# Patient Record
Sex: Female | Born: 1960 | Smoking: Current every day smoker
Health system: Southern US, Community
[De-identification: ages and names within clinical notes are randomized; demographics above are authoritative.]

## PROBLEM LIST (undated history)

## (undated) DIAGNOSIS — C801 Malignant (primary) neoplasm, unspecified: Secondary | ICD-10-CM

---

## 2019-10-14 ENCOUNTER — Other Ambulatory Visit: Payer: Self-pay | Admitting: Physician Assistant

## 2019-10-14 DIAGNOSIS — J438 Other emphysema: Secondary | ICD-10-CM

## 2019-10-14 DIAGNOSIS — Z720 Tobacco use: Secondary | ICD-10-CM

## 2019-10-26 ENCOUNTER — Ambulatory Visit
Admission: RE | Admit: 2019-10-26 | Discharge: 2019-10-26 | Disposition: A | Discharge status: Home / Self Care | Payer: Medicare Other | Source: Ambulatory Visit | Attending: Physician Assistant | Admitting: Physician Assistant

## 2019-10-26 ENCOUNTER — Other Ambulatory Visit: Payer: Self-pay

## 2019-10-26 DIAGNOSIS — J438 Other emphysema: Secondary | ICD-10-CM

## 2019-10-26 DIAGNOSIS — Z720 Tobacco use: Secondary | ICD-10-CM

## 2020-04-03 ENCOUNTER — Inpatient Hospital Stay (HOSPITAL_COMMUNITY): Payer: Medicare HMO

## 2020-04-03 ENCOUNTER — Inpatient Hospital Stay (HOSPITAL_COMMUNITY)
Admission: EM | Admit: 2020-04-03 | Discharge: 2020-04-08 | DRG: 175 | Disposition: A | Discharge status: Home / Self Care | Payer: Medicare HMO | Attending: Family Medicine | Admitting: Family Medicine

## 2020-04-03 ENCOUNTER — Emergency Department (HOSPITAL_COMMUNITY): Payer: Medicare HMO

## 2020-04-03 ENCOUNTER — Encounter (HOSPITAL_COMMUNITY): Payer: Self-pay

## 2020-04-03 ENCOUNTER — Other Ambulatory Visit: Payer: Self-pay

## 2020-04-03 DIAGNOSIS — E440 Moderate protein-calorie malnutrition: Secondary | ICD-10-CM | POA: Insufficient documentation

## 2020-04-03 DIAGNOSIS — R531 Weakness: Secondary | ICD-10-CM | POA: Diagnosis not present

## 2020-04-03 DIAGNOSIS — R471 Dysarthria and anarthria: Secondary | ICD-10-CM | POA: Diagnosis present

## 2020-04-03 DIAGNOSIS — T451X5A Adverse effect of antineoplastic and immunosuppressive drugs, initial encounter: Secondary | ICD-10-CM | POA: Diagnosis present

## 2020-04-03 DIAGNOSIS — C3491 Malignant neoplasm of unspecified part of right bronchus or lung: Secondary | ICD-10-CM | POA: Diagnosis present

## 2020-04-03 DIAGNOSIS — Z87440 Personal history of urinary (tract) infections: Secondary | ICD-10-CM

## 2020-04-03 DIAGNOSIS — Z20822 Contact with and (suspected) exposure to covid-19: Secondary | ICD-10-CM | POA: Diagnosis present

## 2020-04-03 DIAGNOSIS — I2699 Other pulmonary embolism without acute cor pulmonale: Secondary | ICD-10-CM | POA: Diagnosis present

## 2020-04-03 DIAGNOSIS — R0602 Shortness of breath: Secondary | ICD-10-CM

## 2020-04-03 DIAGNOSIS — I81 Portal vein thrombosis: Secondary | ICD-10-CM | POA: Diagnosis present

## 2020-04-03 DIAGNOSIS — J439 Emphysema, unspecified: Secondary | ICD-10-CM | POA: Diagnosis present

## 2020-04-03 DIAGNOSIS — R609 Edema, unspecified: Secondary | ICD-10-CM

## 2020-04-03 DIAGNOSIS — G319 Degenerative disease of nervous system, unspecified: Secondary | ICD-10-CM | POA: Diagnosis present

## 2020-04-03 DIAGNOSIS — R079 Chest pain, unspecified: Secondary | ICD-10-CM | POA: Diagnosis present

## 2020-04-03 DIAGNOSIS — Z66 Do not resuscitate: Secondary | ICD-10-CM | POA: Diagnosis present

## 2020-04-03 DIAGNOSIS — K862 Cyst of pancreas: Secondary | ICD-10-CM | POA: Diagnosis present

## 2020-04-03 DIAGNOSIS — D638 Anemia in other chronic diseases classified elsewhere: Secondary | ICD-10-CM | POA: Diagnosis present

## 2020-04-03 DIAGNOSIS — Z515 Encounter for palliative care: Secondary | ICD-10-CM

## 2020-04-03 DIAGNOSIS — I700 Atherosclerosis of aorta: Secondary | ICD-10-CM | POA: Diagnosis present

## 2020-04-03 DIAGNOSIS — Z902 Acquired absence of lung [part of]: Secondary | ICD-10-CM

## 2020-04-03 DIAGNOSIS — R160 Hepatomegaly, not elsewhere classified: Secondary | ICD-10-CM | POA: Diagnosis present

## 2020-04-03 DIAGNOSIS — A09 Infectious gastroenteritis and colitis, unspecified: Secondary | ICD-10-CM | POA: Diagnosis present

## 2020-04-03 DIAGNOSIS — C787 Secondary malignant neoplasm of liver and intrahepatic bile duct: Secondary | ICD-10-CM | POA: Diagnosis present

## 2020-04-03 DIAGNOSIS — M7989 Other specified soft tissue disorders: Secondary | ICD-10-CM | POA: Diagnosis present

## 2020-04-03 DIAGNOSIS — J918 Pleural effusion in other conditions classified elsewhere: Secondary | ICD-10-CM | POA: Diagnosis present

## 2020-04-03 DIAGNOSIS — E876 Hypokalemia: Secondary | ICD-10-CM | POA: Diagnosis present

## 2020-04-03 DIAGNOSIS — R296 Repeated falls: Secondary | ICD-10-CM | POA: Diagnosis present

## 2020-04-03 DIAGNOSIS — Z808 Family history of malignant neoplasm of other organs or systems: Secondary | ICD-10-CM

## 2020-04-03 DIAGNOSIS — R5383 Other fatigue: Secondary | ICD-10-CM | POA: Diagnosis present

## 2020-04-03 DIAGNOSIS — R600 Localized edema: Secondary | ICD-10-CM

## 2020-04-03 DIAGNOSIS — W19XXXA Unspecified fall, initial encounter: Secondary | ICD-10-CM | POA: Diagnosis present

## 2020-04-03 DIAGNOSIS — I82401 Acute embolism and thrombosis of unspecified deep veins of right lower extremity: Secondary | ICD-10-CM | POA: Diagnosis present

## 2020-04-03 DIAGNOSIS — J9601 Acute respiratory failure with hypoxia: Secondary | ICD-10-CM | POA: Diagnosis present

## 2020-04-03 DIAGNOSIS — R5381 Other malaise: Secondary | ICD-10-CM | POA: Diagnosis not present

## 2020-04-03 DIAGNOSIS — E039 Hypothyroidism, unspecified: Secondary | ICD-10-CM | POA: Diagnosis present

## 2020-04-03 DIAGNOSIS — R071 Chest pain on breathing: Secondary | ICD-10-CM | POA: Diagnosis not present

## 2020-04-03 DIAGNOSIS — F17210 Nicotine dependence, cigarettes, uncomplicated: Secondary | ICD-10-CM | POA: Diagnosis present

## 2020-04-03 DIAGNOSIS — D539 Nutritional anemia, unspecified: Secondary | ICD-10-CM | POA: Diagnosis present

## 2020-04-03 DIAGNOSIS — J90 Pleural effusion, not elsewhere classified: Secondary | ICD-10-CM

## 2020-04-03 DIAGNOSIS — D509 Iron deficiency anemia, unspecified: Secondary | ICD-10-CM | POA: Diagnosis present

## 2020-04-03 DIAGNOSIS — F319 Bipolar disorder, unspecified: Secondary | ICD-10-CM | POA: Diagnosis not present

## 2020-04-03 DIAGNOSIS — R188 Other ascites: Secondary | ICD-10-CM | POA: Diagnosis present

## 2020-04-03 DIAGNOSIS — R824 Acetonuria: Secondary | ICD-10-CM | POA: Diagnosis present

## 2020-04-03 DIAGNOSIS — G249 Dystonia, unspecified: Secondary | ICD-10-CM | POA: Diagnosis present

## 2020-04-03 DIAGNOSIS — R946 Abnormal results of thyroid function studies: Secondary | ICD-10-CM | POA: Diagnosis present

## 2020-04-03 DIAGNOSIS — Z882 Allergy status to sulfonamides status: Secondary | ICD-10-CM

## 2020-04-03 DIAGNOSIS — R7989 Other specified abnormal findings of blood chemistry: Secondary | ICD-10-CM

## 2020-04-03 DIAGNOSIS — Z7189 Other specified counseling: Secondary | ICD-10-CM | POA: Diagnosis not present

## 2020-04-03 DIAGNOSIS — Z8507 Personal history of malignant neoplasm of pancreas: Secondary | ICD-10-CM

## 2020-04-03 DIAGNOSIS — Z79899 Other long term (current) drug therapy: Secondary | ICD-10-CM

## 2020-04-03 DIAGNOSIS — Z888 Allergy status to other drugs, medicaments and biological substances status: Secondary | ICD-10-CM

## 2020-04-03 HISTORY — DX: Malignant (primary) neoplasm, unspecified: C80.1

## 2020-04-03 LAB — COMPREHENSIVE METABOLIC PANEL
ALT: 17 U/L (ref 0–44)
AST: 25 U/L (ref 15–41)
Albumin: 1.9 g/dL — ABNORMAL LOW (ref 3.5–5.0)
Alkaline Phosphatase: 121 U/L (ref 38–126)
Anion gap: 12 (ref 5–15)
BUN: 18 mg/dL (ref 6–20)
CO2: 25 mmol/L (ref 22–32)
Calcium: 7.7 mg/dL — ABNORMAL LOW (ref 8.9–10.3)
Chloride: 98 mmol/L (ref 98–111)
Creatinine, Ser: 0.82 mg/dL (ref 0.44–1.00)
GFR, Estimated: >60 mL/min (ref >60)
Glucose, Bld: 86 mg/dL (ref 70–99)
Potassium: 2.9 mmol/L — ABNORMAL LOW (ref 3.5–5.1)
Sodium: 135 mmol/L (ref 135–145)
Total Bilirubin: 0.5 mg/dL (ref 0.3–1.2)
Total Protein: 5.8 g/dL — ABNORMAL LOW (ref 6.5–8.1)

## 2020-04-03 LAB — HIV ANTIBODY (ROUTINE TESTING W REFLEX): HIV Screen 4th Generation wRfx: NONREACTIVE

## 2020-04-03 LAB — CBC WITH DIFFERENTIAL/PLATELET
Abs Immature Granulocytes: 0.84 10*3/uL — ABNORMAL HIGH (ref 0.00–0.07)
Basophils Absolute: 0.1 10*3/uL (ref 0.0–0.1)
Basophils Relative: 1 %
Eosinophils Absolute: 0 10*3/uL (ref 0.0–0.5)
Eosinophils Relative: 0 %
HCT: 27.8 % — ABNORMAL LOW (ref 36.0–46.0)
Hemoglobin: 8.6 g/dL — ABNORMAL LOW (ref 12.0–15.0)
Immature Granulocytes: 4 %
Lymphocytes Relative: 12 %
Lymphs Abs: 2.4 10*3/uL (ref 0.7–4.0)
MCH: 33.6 pg (ref 26.0–34.0)
MCHC: 30.9 g/dL (ref 30.0–36.0)
MCV: 108.6 fL — ABNORMAL HIGH (ref 80.0–100.0)
Monocytes Absolute: 1.9 10*3/uL — ABNORMAL HIGH (ref 0.1–1.0)
Monocytes Relative: 9 %
Neutro Abs: 15.8 10*3/uL — ABNORMAL HIGH (ref 1.7–7.7)
Neutrophils Relative %: 74 %
Platelets: 266 10*3/uL (ref 150–400)
RBC: 2.56 MIL/uL — ABNORMAL LOW (ref 3.87–5.11)
RDW: 25 % — ABNORMAL HIGH (ref 11.5–15.5)
WBC: 21.1 10*3/uL — ABNORMAL HIGH (ref 4.0–10.5)
nRBC: 0.1 % (ref 0.0–0.2)

## 2020-04-03 LAB — URINALYSIS, ROUTINE W REFLEX MICROSCOPIC
Bilirubin Urine: NEGATIVE
Glucose, UA: NEGATIVE mg/dL
Hgb urine dipstick: NEGATIVE
Ketones, ur: 5 mg/dL — AB
Leukocytes,Ua: NEGATIVE
Nitrite: NEGATIVE
Protein, ur: NEGATIVE mg/dL
Specific Gravity, Urine: 1.019 (ref 1.005–1.030)
pH: 6 (ref 5.0–8.0)

## 2020-04-03 LAB — MAGNESIUM: Magnesium: 2.1 mg/dL (ref 1.7–2.4)

## 2020-04-03 LAB — I-STAT BETA HCG BLOOD, ED (MC, WL, AP ONLY): I-stat hCG, quantitative: <5 m[IU]/mL (ref <5)

## 2020-04-03 LAB — TSH: TSH: 11.124 u[IU]/mL — ABNORMAL HIGH (ref 0.350–4.500)

## 2020-04-03 LAB — POC SARS CORONAVIRUS 2 AG -  ED: SARS Coronavirus 2 Ag: NEGATIVE

## 2020-04-03 LAB — D-DIMER, QUANTITATIVE: D-Dimer, Quant: 7.08 ug/mL-FEU — ABNORMAL HIGH (ref 0.00–0.50)

## 2020-04-03 LAB — PROTIME-INR
INR: 2.3 — ABNORMAL HIGH (ref 0.8–1.2)
INR: 2 — ABNORMAL HIGH (ref 0.8–1.2)
Prothrombin Time: 24.1 seconds — ABNORMAL HIGH (ref 11.4–15.2)
Prothrombin Time: 22 seconds — ABNORMAL HIGH (ref 11.4–15.2)

## 2020-04-03 LAB — LACTIC ACID, PLASMA: Lactic Acid, Venous: 1.5 mmol/L (ref 0.5–1.9)

## 2020-04-03 LAB — AMMONIA: Ammonia: 23 umol/L (ref 9–35)

## 2020-04-03 LAB — POC OCCULT BLOOD, ED: Fecal Occult Bld: NEGATIVE

## 2020-04-03 LAB — FOLATE: Folate: 14.7 ng/mL (ref >5.9)

## 2020-04-03 LAB — VITAMIN B12: Vitamin B-12: 4497 pg/mL — ABNORMAL HIGH (ref 180–914)

## 2020-04-03 LAB — PROCALCITONIN: Procalcitonin: 0.41 ng/mL

## 2020-04-03 LAB — BRAIN NATRIURETIC PEPTIDE: B Natriuretic Peptide: 80.3 pg/mL (ref 0.0–100.0)

## 2020-04-03 LAB — TROPONIN I (HIGH SENSITIVITY): Troponin I (High Sensitivity): 8 ng/L (ref <18)

## 2020-04-03 LAB — APTT: aPTT: 45 seconds — ABNORMAL HIGH (ref 24–36)

## 2020-04-03 LAB — CBG MONITORING, ED: Glucose-Capillary: 101 mg/dL — ABNORMAL HIGH (ref 70–99)

## 2020-04-03 MED ORDER — POTASSIUM CHLORIDE CRYS ER 20 MEQ PO TBCR
40.0000 meq | EXTENDED_RELEASE_TABLET | Freq: Once | ORAL | Status: AC
  Administered 2020-04-03: 40 meq via ORAL
  Filled 2020-04-03: qty 2

## 2020-04-03 MED ORDER — CHLORHEXIDINE GLUCONATE CLOTH 2 % EX PADS
6.0000 | MEDICATED_PAD | Freq: Every day | CUTANEOUS | Status: DC
  Administered 2020-04-03 – 2020-04-08 (×6): 6 via TOPICAL

## 2020-04-03 MED ORDER — ONDANSETRON HCL 4 MG PO TABS: 4.0000 mg | ORAL_TABLET | Freq: Four times a day (QID) | ORAL | Status: DC | PRN

## 2020-04-03 MED ORDER — ENOXAPARIN SODIUM 60 MG/0.6ML ~~LOC~~ SOLN
50.0000 mg | Freq: Once | SUBCUTANEOUS | Status: AC
  Administered 2020-04-03: 50 mg via SUBCUTANEOUS
  Filled 2020-04-03: qty 0.6

## 2020-04-03 MED ORDER — IOHEXOL 300 MG/ML  SOLN
100.0000 mL | Freq: Once | INTRAMUSCULAR | Status: AC | PRN
  Administered 2020-04-03: 80 mL via INTRAVENOUS

## 2020-04-03 MED ORDER — MOMETASONE FURO-FORMOTEROL FUM 200-5 MCG/ACT IN AERO
2.0000 | INHALATION_SPRAY | Freq: Two times a day (BID) | RESPIRATORY_TRACT | Status: DC
  Administered 2020-04-03 – 2020-04-08 (×11): 2 via RESPIRATORY_TRACT
  Filled 2020-04-03: qty 8.8

## 2020-04-03 MED ORDER — HEPARIN SOD (PORK) LOCK FLUSH 100 UNIT/ML IV SOLN
500.0000 [IU] | Freq: Once | INTRAVENOUS | Status: AC
  Administered 2020-04-05: 500 [IU] via INTRAVENOUS
  Filled 2020-04-03: qty 5

## 2020-04-03 MED ORDER — FUROSEMIDE 10 MG/ML IJ SOLN
40.0000 mg | Freq: Once | INTRAMUSCULAR | Status: AC
  Administered 2020-04-03: 40 mg via INTRAVENOUS
  Filled 2020-04-03: qty 4

## 2020-04-03 MED ORDER — HEPARIN SOD (PORK) LOCK FLUSH 100 UNIT/ML IV SOLN
INTRAVENOUS | Status: AC
  Filled 2020-04-03: qty 5

## 2020-04-03 MED ORDER — MAGNESIUM SULFATE IN D5W 1-5 GM/100ML-% IV SOLN
1.0000 g | Freq: Once | INTRAVENOUS | Status: AC
  Administered 2020-04-03: 1 g via INTRAVENOUS
  Filled 2020-04-03: qty 100

## 2020-04-03 MED ORDER — ADULT MULTIVITAMIN W/MINERALS CH
1.0000 | ORAL_TABLET | Freq: Every day | ORAL | Status: DC
  Administered 2020-04-03 – 2020-04-08 (×6): 1 via ORAL
  Filled 2020-04-03 (×6): qty 1

## 2020-04-03 MED ORDER — ACETAMINOPHEN 650 MG RE SUPP: 650.0000 mg | Freq: Four times a day (QID) | RECTAL | Status: DC | PRN

## 2020-04-03 MED ORDER — ENSURE ENLIVE PO LIQD
237.0000 mL | Freq: Two times a day (BID) | ORAL | Status: DC
  Administered 2020-04-03 – 2020-04-08 (×6): 237 mL via ORAL

## 2020-04-03 MED ORDER — ALBUTEROL SULFATE HFA 108 (90 BASE) MCG/ACT IN AERS
2.0000 | INHALATION_SPRAY | Freq: Four times a day (QID) | RESPIRATORY_TRACT | Status: DC | PRN
  Filled 2020-04-03: qty 6.7

## 2020-04-03 MED ORDER — POTASSIUM CHLORIDE IN NACL 20-0.9 MEQ/L-% IV SOLN
INTRAVENOUS | Status: AC
  Filled 2020-04-03 (×2): qty 1000

## 2020-04-03 MED ORDER — ONDANSETRON HCL 4 MG/2ML IJ SOLN
4.0000 mg | Freq: Four times a day (QID) | INTRAMUSCULAR | Status: DC | PRN
  Administered 2020-04-04 – 2020-04-08 (×2): 4 mg via INTRAVENOUS
  Filled 2020-04-03 (×2): qty 2

## 2020-04-03 MED ORDER — IOHEXOL 350 MG/ML SOLN
100.0000 mL | Freq: Once | INTRAVENOUS | Status: AC | PRN
  Administered 2020-04-03: 80 mL via INTRAVENOUS

## 2020-04-03 MED ORDER — ENOXAPARIN SODIUM 60 MG/0.6ML ~~LOC~~ SOLN
60.0000 mg | Freq: Two times a day (BID) | SUBCUTANEOUS | Status: DC
  Administered 2020-04-03 – 2020-04-07 (×8): 60 mg via SUBCUTANEOUS
  Filled 2020-04-03 (×8): qty 0.6

## 2020-04-03 MED ORDER — IOHEXOL 9 MG/ML PO SOLN
500.0000 mL | ORAL | Status: AC
  Administered 2020-04-03 (×2): 500 mL via ORAL

## 2020-04-03 MED ORDER — SODIUM CHLORIDE 0.9 % IV SOLN
2.0000 g | INTRAVENOUS | Status: DC
  Administered 2020-04-03 – 2020-04-05 (×3): 2 g via INTRAVENOUS
  Filled 2020-04-03 (×2): qty 2
  Filled 2020-04-03: qty 20

## 2020-04-03 MED ORDER — ACETAMINOPHEN 325 MG PO TABS
650.0000 mg | ORAL_TABLET | Freq: Four times a day (QID) | ORAL | Status: DC | PRN
  Administered 2020-04-04: 650 mg via ORAL
  Filled 2020-04-03: qty 2

## 2020-04-03 MED ORDER — SODIUM CHLORIDE 0.9 % IV SOLN
500.0000 mg | INTRAVENOUS | Status: DC
  Administered 2020-04-03 – 2020-04-05 (×3): 500 mg via INTRAVENOUS
  Filled 2020-04-03 (×3): qty 500

## 2020-04-03 MED ORDER — SENNOSIDES-DOCUSATE SODIUM 8.6-50 MG PO TABS: 1.0000 | ORAL_TABLET | Freq: Every evening | ORAL | Status: DC | PRN

## 2020-04-03 NOTE — H&P (Signed)
History and Physical    April Huff MHD:[id number] DOB: April 12, 1960 DOA: 04/03/2020  PCP: Mindi Curling, PA-C   Patient coming from: Home   Chief Complaint: General weakness, falls, SOB, low oxygen saturation  HPI: April Huff is a 60 y.o. 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  She is accompanied by her sister with whom she lives.  Patient's family has become concerned that the patient is increasingly fatigued and generally weak resulting in multiple falls recently.  She was reportedly saturating 80% at home.  Reports shortness of breath central chest pain that is worse with inspiration, reports hitting her head couple days ago reports persistent headaches, denies loss of consciousness, denies fevers or chills, denies cough.  She has recently developed bilateral lower extremity swelling.  She has not noticed orthopnea.  She recently completed antibiotic for a UTI.  ED Course: Upon arrival to the ED, patient is found to be afebrile, saturating mid to upper 90s on 3 L/min of supplemental oxygen, and with blood pressure 92/60.  EKG features a sinus rhythm and chest x-ray is notable for interval development of left base atelectasis or infiltrate with small left pleural effusion.  Chemistry panel notable for potassium 2.9 and albumin 1.9.  CBC features a leukocytosis to 21,100 and microcytic anemia with hemoglobin 8.6.  Lactic acid is normal.  BNP is normal.  Urinalysis with ketonuria.  Blood and urine cultures were collected in the emergency department and the patient was treated with oral potassium and supplemental oxygen.  Review of Systems:  All other systems reviewed and apart from HPI, are negative.  Past Medical History:  Diagnosis Date  . Cancer  [facility])     History reviewed. No pertinent surgical history.  Social History:   reports that she has been smoking cigarettes. She has a 40.00 pack-year smoking history. She has never used smokeless tobacco. No history on file for alcohol use and drug use.  Allergies  Allergen Reactions  . Nitrofurantoin   . Sulfa Antibiotics     Family History  Problem Relation Age of Onset  . Thyroid cancer Mother      Prior to Admission medications   Not on File    Physical Exam: Vitals:   04/03/20 0400 04/03/20 0430 04/03/20 0510 04/03/20 0556  BP: (!) 158/133 (!) 150/136 92/60 109/60  Pulse: 90 88 92 90  Resp: 15 16 18 18   Temp:   98.1 F (36.7 C) (!) 97.5 F (36.4 C)  TempSrc:   Oral Oral  SpO2: 99% 97% 97% 93%  Weight:    56.5 kg  Height:        Constitutional: NAD, somnolent  Eyes: PERTLA, lids and conjunctivae normal ENMT: Mucous membranes are moist. Posterior pharynx clear of any exudate or lesions.   Neck: normal, supple, no masses, no thyromegaly Respiratory: Diminished breath sounds, no wheezing. No pallor or cyanosis.  Cardiovascular: S1 & S2 heard, regular rate and rhythm. Pretibial pitting edema bilaterally.   Abdomen: No distension, no tenderness, soft. Bowel sounds active.  Musculoskeletal: no clubbing / cyanosis. No joint deformity upper and lower extremities.   Skin: no significant rashes, lesions, ulcers. Warm, dry, well-perfused. Neurologic: CN 2-12 grossly intact. Mild dysarthria. Sensation intact. Moving all extremities.  Psychiatric: Sleeping, wakes to voice and oriented to person, place, and situation. Pleasant  and cooperative.    Labs and Imaging on Admission: I have personally reviewed following labs and imaging studies  CBC: Recent Labs  Lab 04/03/20 0157  WBC 21.1*  NEUTROABS 15.8*  HGB 8.6*  HCT 27.8*  MCV 108.6*  PLT 062   Basic Metabolic Panel: Recent Labs  Lab 04/03/20 0157  NA 135  K 2.9*  CL 98  CO2 25  GLUCOSE 86  BUN 18   CREATININE 0.82  CALCIUM 7.7*   GFR: Estimated Creatinine Clearance: 59.1 mL/min (by C-G formula based on SCr of 0.82 mg/dL). Liver Function Tests: Recent Labs  Lab 04/03/20 0157  AST 25  ALT 17  ALKPHOS 121  BILITOT 0.5  PROT 5.8*  ALBUMIN 1.9*   No results for input(s): LIPASE, AMYLASE in the last 168 hours. No results for input(s): AMMONIA in the last 168 hours. Coagulation Profile: Recent Labs  Lab 04/03/20 0157  INR 2.3*   Cardiac Enzymes: No results for input(s): CKTOTAL, CKMB, CKMBINDEX, TROPONINI in the last 168 hours. BNP (last 3 results) No results for input(s): PROBNP in the last 8760 hours. HbA1C: No results for input(s): HGBA1C in the last 72 hours. CBG: Recent Labs  Lab 04/03/20 0240  GLUCAP 101*   Lipid Profile: No results for input(s): CHOL, HDL, LDLCALC, TRIG, CHOLHDL, LDLDIRECT in the last 72 hours. Thyroid Function Tests: No results for input(s): TSH, T4TOTAL, FREET4, T3FREE, THYROIDAB in the last 72 hours. Anemia Panel: No results for input(s): VITAMINB12, FOLATE, FERRITIN, TIBC, IRON, RETICCTPCT in the last 72 hours. Urine analysis:    Component Value Date/Time   COLORURINE AMBER (A) 04/03/2020 0157   APPEARANCEUR CLEAR 04/03/2020 0157   LABSPEC 1.019 04/03/2020 0157   PHURINE 6.0 04/03/2020 0157   GLUCOSEU NEGATIVE 04/03/2020 0157   HGBUR NEGATIVE 04/03/2020 0157   BILIRUBINUR NEGATIVE 04/03/2020 0157   KETONESUR 5 (A) 04/03/2020 0157   PROTEINUR NEGATIVE 04/03/2020 0157   NITRITE NEGATIVE 04/03/2020 0157   LEUKOCYTESUR NEGATIVE 04/03/2020 0157   Sepsis Labs: @LABRCNTIP (procalcitonin:4,lacticidven:4) )No results found for this or any previous visit (from the past 240 hour(s)).   Radiological Exams on Admission: DG Chest Port 1 View  Result Date: 04/03/2020 CLINICAL DATA:  Sepsis EXAM: PORTABLE CHEST 1 VIEW COMPARISON:  CT 10/26/2019 FINDINGS: Small left pleural effusion has developed with associated left basilar atelectasis or  infiltrate. Minimal right basilar atelectasis. No pneumothorax. Cardiac size within normal limits. Right internal jugular chest port is seen with its tip within the superior vena cava. Pulmonary vascularity is normal. No acute bone abnormality. IMPRESSION: Interval development of left basilar atelectasis or infiltrate with associated small left pleural effusion. Electronically Signed   By: Fidela Salisbury MD   On: 04/03/2020 02:15    EKG: Independently reviewed. Sinus rhythm.   Assessment/Plan   1. Acute hypoxic respiratory failure  - Presents with SOB, general weakness, falls, fatigue, and reports oxygen saturation 80% at home  - There is LLL atelectasis vs infection on CXR with small effusion - She is now reporting pleuritic chest pain  - BNP is normal, no wheezing  - Check d-dimer and procalcitonin, culture sputum, check strep pneumo and legionella antigens, start Rocpehin and azithromycin for now, start incentive spirometry, and continue supplemental O2 as needed   2. Debility, falls   - Presents with SOB, general weakness, fatigue, and recurrent falls at home  - She is somnolent, has mild dysarthria but no definite focal deficit  - Check head CT, TSH, ammonia, B12, and folate, continue supportive  care, consult PT    3. Lung cancer   - Stage IIB adenocarcinoma right lung status post right upper lobectomy in November 2021, follows with Dr. Georgiann Cocker in [place], now on adjuvant chemotherapy with carboplatin/pemetrexed   4. Chest pain  - Patient reports central chest pain, worse with inspiration  - No acute ischemic features noted on EKG  - Check d-dimer and troponin, continue cardiac monitoring    5. COPD  - No wheezing on admission  - Continue inhalers    6. Hypokalemia  - Serum potassium is 2.9  - Replace, repeat chemistry panel   7. Hypothyroidism  - Checking TSH as above, follow-up pharmacy medication reconciliation    8. Bipolar disorder  - Plan to continue home  medications pending pharmacy medication reconciliation    9. Leg swelling  - Symmetric pitting edema to b/l LEs with normal BNP and no known hx of CHF  - Likely related to low albumin (1.9)  - Elevated legs, consult dietary   10. Macrocytic anemia  - Hgb is 8.6 on admission, up from 7.2 three weeks ago  - FOBT is negative, likely related to chemotherapy - Check B12 and folate, transfuse as needed    DVT prophylaxis: SCDs pending head CT  Code Status: Full  Level of Care: Level of care: Telemetry Family Communication: None available at time of admission  Disposition Plan:  Patient is from: Home  Anticipated d/c is to: TBD Anticipated d/c date is: 04/06/20 Patient currently: Pending CT head, additional labs as above, therapy assessment  Consults called: none  Admission status: Inpatient     Vianne Bulls, MD [facility]  04/03/2020, 6:26 AM

## 2020-04-03 NOTE — ED Triage Notes (Addendum)
Patient BIB [facility] from home with complaints of hurting all over since she started chemo in January. Patient complaining more about the pain in her chest tonight. Patient had right upper lobe removed of lung. Last treatment was February 1st. Patient has had multiple falls over the past month, weak, in a lot of pain. Patient had UTI and finished antibiotics yesterday.   EMS vitals HR 90 100/60 Capnography 22-24 RR 18 CBG 132 3L 97% at home, EMS put her back on 1L  22G left forearm 375mL normal saline given in route

## 2020-04-03 NOTE — Progress Notes (Signed)
PT Cancellation Note  Patient Details Name: April Huff MRN: [id number] DOB: 06/17/1960   Cancelled Treatment:    Reason Eval/Treat Not Completed: Medical issues which prohibited therapy - acute PE, just received first dose of Lovenox and per PT protocol pt not to be mobilized until 3 hours post-Lovenox administration. PT to check back tomorrow.  Stacie Glaze, PT Acute Rehabilitation Services Pager [phone]  Office [phone]    Louis Matte 04/03/2020, 3:34 PM

## 2020-04-03 NOTE — ED Notes (Signed)
Asked patient to provide urine sample.

## 2020-04-03 NOTE — Progress Notes (Signed)
PT Cancellation Note  Patient Details Name: April Huff MRN: [id number] DOB: 1961-01-13   Cancelled Treatment:    Reason Eval/Treat Not Completed: Patient at procedure or test/unavailable - Pt going to CT in 10-15 minutes, PT to check back as schedule allows for PT evaluation.   Stacie Glaze, PT Acute Rehabilitation Services Pager [phone]  Office [phone]    Louis Matte 04/03/2020, 12:10 PM

## 2020-04-03 NOTE — ED Provider Notes (Signed)
[facility] DEPT Provider Note   CSN: [id number] Arrival date & time: 04/03/20  0135     History Chief Complaint  Patient presents with  . Fatigue    April Huff is a 60 y.o. female.  Patient presents to the emergency department with a chief complaint of shortness of breath.  She has history of lung cancer, and had prior lobectomy.  She [facility].  She stays with her sister at home.  Sister reports that the patient has had multiple falls recently due to weakness.  She states that she gets very winded when she is up and walking.  States that her O2 saturation was in the mid 80s earlier tonight, and she called EMS.  Patient has improved with 3 L nasal cannula from EMS, and has now been able to tolerate regular 1 L nasal cannula.  She denies cough.  Denies fever.  She does state that she feels very weak.  She was recently treated for UTI and just finished the antibiotics.  She is receiving chemotherapy for her lung cancer.  The history is provided by the patient. No language interpreter was used.       Past Medical History:  Diagnosis Date  . Cancer ([place])     There are no problems to display for this patient.   History reviewed. No pertinent surgical history.   OB History   No obstetric history on file.     History reviewed. No pertinent family history.     Home Medications Prior to Admission medications   Not on File    Allergies    Nitrofurantoin and Sulfa antibiotics  Review of Systems   Review of Systems  All other systems reviewed and are negative.   Physical Exam Updated Vital Signs BP (!) 162/142   Pulse 93   Temp 99.2 F (37.3 C) (Rectal)   Resp 14   Ht 5\' 2"  (1.575 m)   Wt 61.2 kg   SpO2 97%   BMI 24.69 kg/m   Physical Exam Vitals and nursing note reviewed.  Constitutional:      General: She is not in acute distress.    Appearance: She is well-developed and well-nourished.     Comments:  Chronically ill-appearing  HENT:     Head: Normocephalic and atraumatic.  Eyes:     Conjunctiva/sclera: Conjunctivae normal.  Cardiovascular:     Rate and Rhythm: Regular rhythm. Tachycardia present.     Heart sounds: No murmur heard.   Pulmonary:     Effort: Pulmonary effort is normal. No respiratory distress.     Breath sounds: Normal breath sounds.  Abdominal:     Palpations: Abdomen is soft.     Tenderness: There is no abdominal tenderness.  Musculoskeletal:     Cervical back: Neck supple.     Right lower leg: Edema present.     Left lower leg: Edema present.  Skin:    General: Skin is warm and dry.  Neurological:     Mental Status: She is alert and oriented to person, place, and time.  Psychiatric:        Mood and Affect: Mood and affect and mood normal.        Behavior: Behavior normal.     ED Results / Procedures / Treatments   Labs (all labs ordered are listed, but only abnormal results are displayed) Labs Reviewed  COMPREHENSIVE METABOLIC PANEL - Abnormal; Notable for the following components:  Result Value   Potassium 2.9 (*)    Calcium 7.7 (*)    Total Protein 5.8 (*)    Albumin 1.9 (*)    All other components within normal limits  CBC WITH DIFFERENTIAL/PLATELET - Abnormal; Notable for the following components:   WBC 21.1 (*)    RBC 2.56 (*)    Hemoglobin 8.6 (*)    HCT 27.8 (*)    MCV 108.6 (*)    RDW 25.0 (*)    Neutro Abs 15.8 (*)    Monocytes Absolute 1.9 (*)    Abs Immature Granulocytes 0.84 (*)    All other components within normal limits  PROTIME-INR - Abnormal; Notable for the following components:   Prothrombin Time 24.1 (*)    INR 2.3 (*)    All other components within normal limits  APTT - Abnormal; Notable for the following components:   aPTT 45 (*)    All other components within normal limits  CBG MONITORING, ED - Abnormal; Notable for the following components:   Glucose-Capillary 101 (*)    All other components within normal  limits  CULTURE, BLOOD (SINGLE)  URINE CULTURE  BRAIN NATRIURETIC PEPTIDE  LACTIC ACID, PLASMA  LACTIC ACID, PLASMA  URINALYSIS, ROUTINE W REFLEX MICROSCOPIC  I-STAT BETA HCG BLOOD, ED ([facility], [facility], [facility] ONLY)  POC OCCULT BLOOD, ED  POC SARS CORONAVIRUS 2 AG -  ED    EKG EKG Interpretation  Date/Time:  Monday April 03 2020 02:28:37 EST Ventricular Rate:  90 PR Interval:    QRS Duration: 76 QT Interval:  377 QTC Calculation: 462 R Axis:   36 Text Interpretation: Sinus rhythm Borderline low voltage, extremity leads Confirmed by Veryl Speak [phone]) on 04/03/2020 2:31:48 AM   Radiology DG Chest Port 1 View  Result Date: 04/03/2020 CLINICAL DATA:  Sepsis EXAM: PORTABLE CHEST 1 VIEW COMPARISON:  CT 10/26/2019 FINDINGS: Small left pleural effusion has developed with associated left basilar atelectasis or infiltrate. Minimal right basilar atelectasis. No pneumothorax. Cardiac size within normal limits. Right internal jugular chest port is seen with its tip within the superior vena cava. Pulmonary vascularity is normal. No acute bone abnormality. IMPRESSION: Interval development of left basilar atelectasis or infiltrate with associated small left pleural effusion. Electronically Signed   By: Fidela Salisbury MD   On: 04/03/2020 02:15    Procedures Procedures   Medications Ordered in ED Medications - No data to display  ED Course  I have reviewed the triage vital signs and the nursing notes.  Pertinent labs & imaging results that were available during my care of the patient were reviewed by me and considered in my medical decision making (see chart for details).    MDM Rules/Calculators/A&P                          This patient complains of shortness of breath, this involves an extensive number of treatment options, and is a complaint that carries with it a high risk of complications and morbidity.    Differential Dx Pneumonia, CHF, sepsis, PE has history of lung cancer and  lobectomy  Pertinent Labs I ordered, reviewed, and interpreted labs, which included CBC notable for elevated white blood cell count at 21.1, potassium is 2.9, hemoglobin 8.6  Hemoccult is negative.  Imaging Interpretation I ordered imaging studies which included chest x-ray, which showed interval development of left basilar atelectasis or infiltrate along with small left-sided pleural effusion.   Sources Additional history  obtained from sister, who reports that the patient has been very winded and has had several falls. Previous records obtained and reviewed.   Critical Interventions  None  Reassessments After the interventions stated above, I reevaluated the patient and found agreeable with plan for admission.  Consultants Appreciate Dr. Myna Hidalgo for admitting for SOB.  Plan Admit    Final Clinical Impression(s) / ED Diagnoses Final diagnoses:  SOB (shortness of breath)  Peripheral edema  Pleural effusion    Rx / DC Orders ED Discharge Orders    None       Montine Circle, PA-C 04/03/20 0423    Veryl Speak, MD 04/03/20 [phone]

## 2020-04-03 NOTE — Progress Notes (Signed)
Initial Nutrition Assessment  DOCUMENTATION CODES:   Non-severe (moderate) malnutrition in context of chronic illness  INTERVENTION:   -Ensure Enlive po BID, each supplement provides 350 kcal and 20 grams of protein  -Multivitamin with minerals daily  NUTRITION DIAGNOSIS:   Moderate Malnutrition related to cancer and cancer related treatments,chronic illness as evidenced by energy intake < or equal to 75% for > or equal to 1 month,moderate fat depletion,moderate muscle depletion.  GOAL:   Patient will meet greater than or equal to 90% of their needs  MONITOR:   PO intake,Supplement acceptance,Labs,Weight trends,I & O's  REASON FOR ASSESSMENT:   Consult Assessment of nutrition requirement/status  ASSESSMENT:   60 year old female with a past medical history significant for but not limited to COPD, bipolar disorder, hypothyroidism, adenocarcinoma of the right lung status post right upper lobe lobectomy now chemotherapy as well as other comorbidities who presented to the ED for evaluation of generalized weakness, shortness of breath and falls.  Patient in room, pretty drowsy. Pt kept falling asleep until her lunch tray came and she was set up to eat. Pt with noticeable tremor but states she is able to eat independently. Pt reports her appetite has not been good since starting chemo in January. When asked is she is hungry today, she states 'a little bit'.   No weight history in chart. Per nursing documentation, pt with moderate BLE edema.   Medications: KLOR-CON, IV Mg sulfate  Labs reviewed: CBGs: 101  NUTRITION - FOCUSED PHYSICAL EXAM:  Flowsheet Row Most Recent Value  Orbital Region Mild depletion  Upper Arm Region Moderate depletion  Thoracic and Lumbar Region Unable to assess  Buccal Region Mild depletion  Temple Region Mild depletion  Clavicle Bone Region Mild depletion  Clavicle and Acromion Bone Region Mild depletion  Scapular Bone Region Mild depletion   Dorsal Hand Moderate depletion  Patellar Region Unable to assess  Anterior Thigh Region Unable to assess  Posterior Calf Region Unable to assess  Edema (RD Assessment) Moderate       Diet Order:   Diet Order            Diet Heart Room service appropriate? Yes; Fluid consistency: Thin  Diet effective now                 EDUCATION NEEDS:   No education needs have been identified at this time  Skin:  Skin Assessment: Reviewed RN Assessment  Last BM:  2/27  Height:   Ht Readings from Last 1 Encounters:  04/03/20 5\' 2"  (1.575 m)    Weight:   Wt Readings from Last 1 Encounters:  04/03/20 56.5 kg    BMI:  Body mass index is 22.78 kg/m.  Estimated Nutritional Needs:   Kcal:  1450-1650  Protein:  70-80g  Fluid:  1.6L/day   Clayton Bibles, MS, RD, LDN Inpatient Clinical Dietitian Contact information available via Amion

## 2020-04-03 NOTE — Progress Notes (Signed)
Bilateral lower extremity venous duplex has been completed. Preliminary results can be found in CV Proc through chart review.  Results were given to the patient's nurse, [facility].  04/03/20 11:03 AM Carlos Levering RVT

## 2020-04-03 NOTE — Progress Notes (Signed)
Care started prior to midnight in the emergency room and patient is admitted early this morning after midnight by Dr. Christia Reading Opyd and I am in current agreement with his assessment and plan.  Additional changes to the plan of care been made accordingly.  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  She is accompanied by her sister whom she lives with.  She has been increasingly fatigued and generally weak resulting in multiple falls recently.  Reportedly saturation at home was 80%.  Also reports shortness of breath and central chest pain that is worse with inspiration and also reported hitting her head after a fall and reporting persistent headaches.  She denies any loss of consciousness, fevers or chills.  Recently he developed bilateral lower extremity swelling.  She has not noticed any orthopnea.  Of note she recently completed antibiotics for urinary tract infection.  On arrival to the emergency room she is found to be afebrile but saturating mid to upper 90s on 3 L of supplemental oxygen with soft blood pressure of 92/60.  Chest x-ray was notable for interval development of left base atelectasis or infiltrate with small pleural effusion.  She is noted to be hypokalemic and had a significantly low albumin at 1.9.  She had an elevated WBC of 21.1 and a microcytic anemia.  BMP and lactic acid were normal.  Blood and urine cultures were collected in the emergency room and she was given oral potassium supplementation.  Currently she is being admitted and treated for the following but not limited to:  Acute hypoxic respiratory failure  - Presents with SOB, general weakness, falls, fatigue, and reports oxygen  saturation 80% at home  - There is LLL atelectasis vs infection on CXR with small effusion - She is now reporting pleuritic chest pain  -SpO2: 97 % O2 Flow Rate (L/min): 3 L/min - BNP is normal at 80.3, no wheezing  - Check d-dimer was elevated to 7.08 and procalcitonin was 0.41, culture sputum, check strep pneumo and legionella antigens,  -She was initiated Rocpehin and azithromycin for now and will continue, start incentive spirometry, and continue supplemental O2 as needed  -We will check a CTA of the chest to rule out PE and also extremity Dopplers given her lower leg swelling. 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 Georgiann Cocker in [place], now on adjuvant chemotherapy with carboplatin/pemetrexed  -Plan a CTA of the chest to rule out PE and evaluate as well  Chest pain  - Patient reports central chest pain, worse with inspiration  - No acute ischemic features noted on EKG  - Check d-dimer 7.08 and troponin was 8, continue cardiac monitoring   -If necessary will consider obtaining echocardiogram  COPD  - No wheezing on admission  - Continue inhalers   -  Requiring supplemental oxygen via nasal cannula as above and wean as tolerated  Hypokalemia  - Serum potassium is 2.9  - Replace, repeat chemistry panel   Hypothyroidism  - Checking TSH as above and was 11.124, follow-up pharmacy medication reconciliation   -Check free T4 and T3  Bipolar disorder  - Plan to continue home medications pending pharmacy medication reconciliation    Leg swelling  -  Symmetric pitting edema to b/l LEs with normal BNP and no known hx of CHF  - Likely related to low albumin (1.9) but will need to rule out VTE and obtain lower extremity duplex given elevated D-dimer -Necessary will consider obtaining echocardiogram - Elevated legs, consult dietary   Macrocytic anemia  - Hgb is 8.6 on admission, up from 7.2 three weeks ago  - FOBT is negative, likely related to chemotherapy - Check B12 and was 4497 and folate was 14.7, transfuse as needed   We will continue to monitor the patient's clinical response to intervention and repeat blood work and imaging in the a.m.

## 2020-04-03 NOTE — Progress Notes (Signed)
Date and time results received: 04/03/20 20:45  Test: CT of abdomen Critical Value: See results of imaging in Epic-results tab/Radiology call from [facility] per  Dr. Owens Shark.   Name of Provider Notified: Hospitalist Ouma  Orders Received? Or Actions Taken?:Just paged, awaiting orders.

## 2020-04-04 ENCOUNTER — Inpatient Hospital Stay (HOSPITAL_COMMUNITY): Payer: Medicare HMO

## 2020-04-04 DIAGNOSIS — F319 Bipolar disorder, unspecified: Secondary | ICD-10-CM | POA: Diagnosis not present

## 2020-04-04 DIAGNOSIS — R071 Chest pain on breathing: Secondary | ICD-10-CM | POA: Diagnosis not present

## 2020-04-04 DIAGNOSIS — J9601 Acute respiratory failure with hypoxia: Secondary | ICD-10-CM | POA: Diagnosis not present

## 2020-04-04 DIAGNOSIS — K8689 Other specified diseases of pancreas: Secondary | ICD-10-CM

## 2020-04-04 DIAGNOSIS — R160 Hepatomegaly, not elsewhere classified: Secondary | ICD-10-CM

## 2020-04-04 DIAGNOSIS — C3491 Malignant neoplasm of unspecified part of right bronchus or lung: Secondary | ICD-10-CM | POA: Diagnosis not present

## 2020-04-04 LAB — CBC WITH DIFFERENTIAL/PLATELET
Abs Immature Granulocytes: 0.49 10*3/uL — ABNORMAL HIGH (ref 0.00–0.07)
Basophils Absolute: 0.1 10*3/uL (ref 0.0–0.1)
Basophils Relative: 1 %
Eosinophils Absolute: 0 10*3/uL (ref 0.0–0.5)
Eosinophils Relative: 0 %
HCT: 23.7 % — ABNORMAL LOW (ref 36.0–46.0)
Hemoglobin: 7.1 g/dL — ABNORMAL LOW (ref 12.0–15.0)
Immature Granulocytes: 3 %
Lymphocytes Relative: 14 %
Lymphs Abs: 2.5 10*3/uL (ref 0.7–4.0)
MCH: 33 pg (ref 26.0–34.0)
MCHC: 30 g/dL (ref 30.0–36.0)
MCV: 110.2 fL — ABNORMAL HIGH (ref 80.0–100.0)
Monocytes Absolute: 1.6 10*3/uL — ABNORMAL HIGH (ref 0.1–1.0)
Monocytes Relative: 9 %
Neutro Abs: 12.8 10*3/uL — ABNORMAL HIGH (ref 1.7–7.7)
Neutrophils Relative %: 73 %
Platelets: 187 10*3/uL (ref 150–400)
RBC: 2.15 MIL/uL — ABNORMAL LOW (ref 3.87–5.11)
RDW: 24.5 % — ABNORMAL HIGH (ref 11.5–15.5)
WBC: 17.4 10*3/uL — ABNORMAL HIGH (ref 4.0–10.5)
nRBC: 0.2 % (ref 0.0–0.2)

## 2020-04-04 LAB — STREP PNEUMONIAE URINARY ANTIGEN: Strep Pneumo Urinary Antigen: NEGATIVE

## 2020-04-04 LAB — PROCALCITONIN: Procalcitonin: 0.24 ng/mL

## 2020-04-04 LAB — COMPREHENSIVE METABOLIC PANEL
ALT: 14 U/L (ref 0–44)
AST: 20 U/L (ref 15–41)
Albumin: 1.4 g/dL — ABNORMAL LOW (ref 3.5–5.0)
Alkaline Phosphatase: 93 U/L (ref 38–126)
Anion gap: 10 (ref 5–15)
BUN: 14 mg/dL (ref 6–20)
CO2: 24 mmol/L (ref 22–32)
Calcium: 7.3 mg/dL — ABNORMAL LOW (ref 8.9–10.3)
Chloride: 100 mmol/L (ref 98–111)
Creatinine, Ser: 0.62 mg/dL (ref 0.44–1.00)
GFR, Estimated: >60 mL/min (ref >60)
Glucose, Bld: 69 mg/dL — ABNORMAL LOW (ref 70–99)
Potassium: 3.5 mmol/L (ref 3.5–5.1)
Sodium: 134 mmol/L — ABNORMAL LOW (ref 135–145)
Total Bilirubin: 0.7 mg/dL (ref 0.3–1.2)
Total Protein: 4.6 g/dL — ABNORMAL LOW (ref 6.5–8.1)

## 2020-04-04 LAB — PHOSPHORUS: Phosphorus: 2.8 mg/dL (ref 2.5–4.6)

## 2020-04-04 LAB — MAGNESIUM: Magnesium: 2 mg/dL (ref 1.7–2.4)

## 2020-04-04 MED ORDER — LACTATED RINGERS IV BOLUS
500.0000 mL | Freq: Once | INTRAVENOUS | Status: AC
  Administered 2020-04-04: 500 mL via INTRAVENOUS

## 2020-04-04 MED ORDER — SODIUM CHLORIDE 0.9% FLUSH
10.0000 mL | Freq: Two times a day (BID) | INTRAVENOUS | Status: DC
  Administered 2020-04-04 – 2020-04-08 (×9): 10 mL

## 2020-04-04 MED ORDER — ALTEPLASE 2 MG IJ SOLR
2.0000 mg | Freq: Once | INTRAMUSCULAR | Status: AC
  Administered 2020-04-04: 2 mg
  Filled 2020-04-04: qty 2

## 2020-04-04 MED ORDER — SODIUM CHLORIDE 0.9% FLUSH: 10.0000 mL | INTRAVENOUS | Status: DC | PRN

## 2020-04-04 NOTE — Progress Notes (Signed)
   04/04/20 0143  Assess: MEWS Score  Temp 97.6 F (36.4 C)  BP (!) 81/59  Pulse Rate (!) 127  Level of Consciousness Alert  SpO2 93 %  O2 Device Nasal Cannula  O2 Flow Rate (L/min) 4 L/min  Assess: MEWS Score  MEWS Temp 0  MEWS Systolic 1  MEWS Pulse 2  MEWS RR 0  MEWS LOC 0  MEWS Score 3  MEWS Score Color Yellow  Treat  Pain Scale 0-10  Pain Score 7  Pain Type Acute pain  Pain Location Head  Pain Orientation Right;Left;Mid  Pain Descriptors / Indicators Aching  Pain Frequency Occasional  Pain Onset Awakened from sleep  Patients Stated Pain Goal 2  Pain Intervention(s) Medication (See eMAR)  Complains of  (pain)  Neuro symptoms relieved by Relaxation techniques (Comment);Rest  Take Vital Signs  Increase Vital Sign Frequency  Yellow: Q 2hr X 2 then Q 4hr X 2, if remains yellow, continue Q 4hrs  Escalate  MEWS: Escalate Yellow: discuss with charge nurse/RN and consider discussing with provider and RRT  Notify: Charge Nurse/RN  Name of Charge Nurse/RN Notified Carmin Richmond, Rn  Date Charge Nurse/RN Notified 04/04/20  Time Charge Nurse/RN Notified [phone]  Notify: Provider  Provider Name/Title Ouma, hospitalist  Date Provider Notified 04/04/20  Time Provider Notified [phone]  Notification Type  (secure chat)  Notification Reason Other (Comment) (low BP)  Provider response See new orders  Date of Provider Response 04/04/20  Time of Provider Response [phone]  Proider requested a BP check and patient has low BP and high pulse.

## 2020-04-04 NOTE — Progress Notes (Signed)
Patient's sister Alinda Dooms called to check on patient informed her about the night the patient had. She wanted to know when the physician will be in to speak with her and I informed her that it will mostly likely be after 7 a.m. Informed her that I will pass this request on to the dayshift nurse.

## 2020-04-04 NOTE — NC FL2 (Signed)
[place] LEVEL OF CARE SCREENING TOOL     IDENTIFICATION  Patient Name: April Huff Birthdate: July 29, 1960 Sex: female Admission Date (Current Location): 04/03/2020  [facility] and [place] Number:  Herbalist and Address:  [facility],  [address] [place], [place]      Provider Number: [id number]  Attending Physician Name and Address:  Kerney Elbe, DO  Relative Name and Phone Number:       Current Level of Care: Hospital Recommended Level of Care: [place] Prior Approval Number:    Date Approved/Denied:   PASRR Number: Pending  Discharge Plan: SNF    Current Diagnoses: Patient Active Problem List   Diagnosis Date Noted  . Acute respiratory failure with hypoxia ([place]) 04/03/2020  . Adenocarcinoma of right lung ([place]) 04/03/2020  . Debility 04/03/2020  . Macrocytic anemia 04/03/2020  . Hypokalemia 04/03/2020  . Leg swelling 04/03/2020  . COPD (chronic obstructive pulmonary disease) with emphysema ([place]) 04/03/2020  . Bipolar disorder ([place]) 04/03/2020  . Hypothyroid 04/03/2020  . Chest pain 04/03/2020  . Malnutrition of moderate degree 04/03/2020  . Peripheral edema     Orientation RESPIRATION BLADDER Height & Weight     Self,Time,Situation,Place  O2 Continent Weight: 56.5 kg Height:  5\' 2"  (157.5 cm)  BEHAVIORAL SYMPTOMS/MOOD NEUROLOGICAL BOWEL NUTRITION STATUS      Continent Diet (Heart Healthy)  AMBULATORY STATUS COMMUNICATION OF NEEDS Skin   Extensive Assist Verbally Bruising                       Personal Care Assistance Level of Assistance  Bathing,Dressing Bathing Assistance: Limited assistance   Dressing Assistance: Limited assistance     Functional Limitations Info             SPECIAL CARE FACTORS FREQUENCY  PT (By licensed PT),OT (By licensed OT)     PT Frequency: 5 x weekly OT Frequency: 5 x weekly            Contractures Contractures Info: Not present     Additional Factors Info  Code Status,Allergies Code Status Info: Full Allergies Info: Nitrofurantoin, Sulfa Antibiotics           Current Medications (04/04/2020):  This is the current hospital active medication list Current Facility-Administered Medications  Medication Dose Route Frequency Provider Last Rate Last Admin  . acetaminophen (TYLENOL) tablet 650 mg  650 mg Oral Q6H PRN Opyd, Ilene Qua, MD   650 mg at 04/04/20 0205   Or  . acetaminophen (TYLENOL) suppository 650 mg  650 mg Rectal Q6H PRN Opyd, Ilene Qua, MD      . albuterol (VENTOLIN HFA) 108 (90 Base) MCG/ACT inhaler 2 puff  2 puff Inhalation Q6H PRN Opyd, Ilene Qua, MD      . alteplase (CATHFLO ACTIVASE) injection 2 mg  2 mg Intracatheter Once Sheikh, Omair Latif, DO      . azithromycin (ZITHROMAX) 500 mg in sodium chloride 0.9 % 250 mL IVPB  500 mg Intravenous Q24H Vianne Bulls, MD 250 mL/hr at 04/04/20 0847 500 mg at 04/04/20 0847  . cefTRIAXone (ROCEPHIN) 2 g in sodium chloride 0.9 % 100 mL IVPB  2 g Intravenous Q24H Opyd, Ilene Qua, MD 200 mL/hr at 04/04/20 0809 2 g at 04/04/20 0809  . Chlorhexidine Gluconate Cloth 2 % PADS 6 each  6 each Topical Daily Raiford Noble [place], [place]   6 each at 04/04/20 1056  . enoxaparin (LOVENOX) injection  60 mg  60 mg Subcutaneous Q12H SheikhGeorgina Quint [place], DO   60 mg at 04/04/20 1055  . feeding supplement (ENSURE ENLIVE / ENSURE PLUS) liquid 237 mL  237 mL Oral BID BM Sheikh, Omair Latif, DO   237 mL at 04/04/20 1055  . heparin lock flush 100 unit/mL  500 Units Intravenous Once Sheikh, Omair Latif, DO      . mometasone-formoterol (DULERA) 200-5 MCG/ACT inhaler 2 puff  2 puff Inhalation BID Opyd, Ilene Qua, MD   2 puff at 04/04/20 0834  . multivitamin with minerals tablet 1 tablet  1 tablet Oral Daily Raiford Noble [place], DO   1 tablet at 04/04/20 1055  . ondansetron (ZOFRAN) tablet 4 mg  4 mg Oral Q6H PRN Opyd, Ilene Qua, MD       Or  . ondansetron (ZOFRAN) injection 4 mg  4 mg  Intravenous Q6H PRN Opyd, Ilene Qua, MD      . senna-docusate (Senokot-S) tablet 1 tablet  1 tablet Oral QHS PRN Opyd, Ilene Qua, MD      . sodium chloride flush (NS) 0.9 % injection 10-40 mL  10-40 mL Intracatheter Q12H Sheikh, Omair Latif, DO   10 mL at 04/04/20 1056  . sodium chloride flush (NS) 0.9 % injection 10-40 mL  10-40 mL Intracatheter PRN Kerney Elbe, DO         Discharge Medications: Please see discharge summary for a list of discharge medications.  Relevant Imaging Results:  Relevant Lab Results:   Additional Information ss# [id number]  Markeshia Giebel, Marjie Skiff, RN

## 2020-04-04 NOTE — TOC Initial Note (Signed)
Transition of Care [facility]) - Initial/Assessment Note    Patient Details  Name: April Huff MRN: [id number] Date of Birth: 20-Aug-1960  Transition of [facility]) CM/SW Contact:    Julane Crock, Marjie Skiff, RN Phone Number: 04/04/2020, 2:20 PM  Clinical Narrative:                 Spoke with pt at bedside along with her sister whom she lives with. Pt has had multiple falls recently at home. Physical Therapy recommendations gone over with pt and sister. Permission given to fax out FL2 to area SNF's for bed availability. Started [facility] review, they have requested additional information. Unsure if pt is to continue chemotherapy at dc. This will potentially effect placement. TOC will follow.  Expected Discharge Plan: Skilled Nursing Facility Barriers to Discharge: Continued Medical Work up   Patient Goals and CMS Choice Patient states their goals for this hospitalization and ongoing recovery are:: To go home      Expected Discharge Plan and Services Expected Discharge Plan: [place]   Discharge Planning Services: CM Consult   Living arrangements for the past 2 months: Single Family Home                       Prior Living Arrangements/Services Living arrangements for the past 2 months: Single Family Home Lives with:: Siblings          Need for Family Participation in Patient Care: Yes (Comment) Care giver support system in place?: Yes (comment)      Activities of Daily Living Home Assistive Devices/Equipment: Eyeglasses,Oxygen,Walker (specify type) ADL Screening (condition at time of admission) Patient's cognitive ability adequate to safely complete daily activities?: Yes Is the patient deaf or have difficulty hearing?: No Does the patient have difficulty seeing, even when wearing glasses/contacts?: No Does the patient have difficulty concentrating, remembering, or making decisions?: No Patient able to express need for assistance with ADLs?: Yes Does the patient have  difficulty dressing or bathing?: Yes Independently performs ADLs?: No Communication: Independent Dressing (OT): Independent Grooming: Independent Feeding: Independent Bathing: Needs assistance Is this a change from baseline?: Pre-admission baseline Toileting: Needs assistance Is this a change from baseline?: Pre-admission baseline In/Out Bed: Needs assistance Is this a change from baseline?: Pre-admission baseline Walks in Home: Needs assistance Is this a change from baseline?: Pre-admission baseline Does the patient have difficulty walking or climbing stairs?: Yes Weakness of Legs: Both Weakness of Arms/Hands: Both  Permission Sought/Granted                  Emotional Assessment Appearance:: Appears older than stated age Attitude/Demeanor/Rapport: Guarded Affect (typically observed): Calm Orientation: : Oriented to Self,Oriented to Place,Oriented to Situation,Oriented to  Time Alcohol / Substance Use: Not Applicable    Admission diagnosis:  Peripheral edema [R60.9] SOB (shortness of breath) [R06.02] Pleural effusion [J90] Acute respiratory failure with hypoxia ([place]) [J96.01] Patient Active Problem List   Diagnosis Date Noted  . Acute respiratory failure with hypoxia ([place]) 04/03/2020  . Adenocarcinoma of right lung ([place]) 04/03/2020  . Debility 04/03/2020  . Macrocytic anemia 04/03/2020  . Hypokalemia 04/03/2020  . Leg swelling 04/03/2020  . COPD (chronic obstructive pulmonary disease) with emphysema ([place]) 04/03/2020  . Bipolar disorder ([place]) 04/03/2020  . Hypothyroid 04/03/2020  . Chest pain 04/03/2020  . Malnutrition of moderate degree 04/03/2020  . Peripheral edema    PCP:  Juanda [facility]:   [place], [place] - [address]  [place] [place] [address] Phone: [phone] Fax: [phone]     Social Determinants of Health (SDOH) Interventions    Readmission Risk  Interventions Readmission Risk Prevention Plan 04/04/2020  Transportation Screening Complete  PCP or Specialist Appt within 3-5 Days Complete  HRI or [place] Complete  Social Work Consult for [place] Planning/Counseling Complete  Palliative Care Screening Not Applicable  Medication Review Press photographer) Complete

## 2020-04-04 NOTE — Evaluation (Signed)
Physical Therapy Evaluation Patient Details Name: April Huff MRN: [id number] DOB: January 06, 1961 Today's Date: 04/04/2020   History of Present Illness  60 yo female [facility]28 with complaints of widespread pain especially chest since starting chemo 02/2020, as well as frequent falls. CXR shows L basilar atelectasis vs infiltrate, L pleural effusion. CT angio chest 2/28 shows PE in lingular pulmonary artery branch, bilateral pleural effusions L>R with atelectasis lower lobes L>R, Imaging showed new masses on R medial lobe liver and pancreas.  Pt with recent UTI and antibiotics. PMH includes stage IIB adenocarcinoma of R lung with previous R upper lobectomy November 2021 and currently undergoing chemo,  COPD, current smoker, bipolar disorder, hypothyroidism.  Clinical Impression  Pt admitted with above diagnosis. Pt is lethargic but arouses to verbal/tactile stimulii, and can follow commands. Min assist for bed to bedside commode transfer, SaO2 91% on 3L O2, HR 123 max with activity. Pt fatigues very quickly, ambulation was deferred 2* fatigue with transfer x 2. Pt lives with her sister who has had several falls recently. Pt will likely need SNF level of care upon acute DC.  Pt currently with functional limitations due to the deficits listed below (see PT Problem List). Pt will benefit from skilled PT to increase their independence and safety with mobility to allow discharge to the venue listed below.       Follow Up Recommendations SNF;Supervision/Assistance - 24 hour;Supervision for mobility/OOB    Equipment Recommendations  Wheelchair cushion (measurements PT);Wheelchair (measurements PT)    Recommendations for Other Services       Precautions / Restrictions Precautions Precautions: Fall Precaution Comments: sister reports pt has had multiple falls recently Restrictions Weight Bearing Restrictions: No      Mobility  Bed Mobility Overal bed mobility: Needs Assistance Bed  Mobility: Supine to Sit     Supine to sit: Min assist     General bed mobility comments: min A to raise trunk and pivot hips to edge of bed    Transfers Overall transfer level: Needs assistance Equipment used: Rolling walker (2 wheeled) Transfers: Sit to/from [facility] Sit to Stand: Min assist Stand pivot transfers: Min assist       General transfer comment: VCs hand placement, min A to power up, min A for balance during SPT, uncontrolled descent, SPT x 2 (bed to 3 in1 to recliner), SaO2 91% on 3L O2 during activity, HR 123 max with activity  Ambulation/Gait             General Gait Details: deferred 2* fatigue with transfers  Stairs            Wheelchair Mobility    Modified Rankin (Stroke Patients Only)       Balance Overall balance assessment: Needs assistance;History of Falls Sitting-balance support: Feet supported;No upper extremity supported Sitting balance-Leahy Scale: Fair     Standing balance support: Bilateral upper extremity supported Standing balance-Leahy Scale: Poor Standing balance comment: relies on BUE support                             Pertinent Vitals/Pain Pain Assessment: No/denies pain    Home Living Family/patient expects to be discharged to:: Private residence Living Arrangements: Other relatives Available Help at Discharge: Family Type of Home: House Home Access: Stairs to enter   Technical brewer of Steps: 2 Home Layout: One level Home Equipment: Environmental consultant - 2 wheels;Bedside commode Additional Comments: lives with sister who  has also had several falls recently; was on 1L home O2 PTA    Prior Function Level of Independence: Needs assistance   Gait / Transfers Assistance Needed: walks with RW  ADL's / Homemaking Assistance Needed: assist needed        Hand Dominance        Extremity/Trunk Assessment   Upper Extremity Assessment Upper Extremity Assessment: Defer to OT  evaluation    Lower Extremity Assessment Lower Extremity Assessment: Generalized weakness (B knee ext -4/5)    Cervical / Trunk Assessment Cervical / Trunk Assessment: Kyphotic  Communication   Communication: No difficulties  Cognition Arousal/Alertness: Lethargic Behavior During Therapy: WFL for tasks assessed/performed Overall Cognitive Status: Within Functional Limits for tasks assessed                                        General Comments      Exercises     Assessment/Plan    PT Assessment Patient needs continued PT services  PT Problem List Decreased strength;Decreased mobility;Decreased activity tolerance;Decreased balance       PT Treatment Interventions Therapeutic activities;Gait training;Therapeutic exercise;Functional mobility training;Patient/family education;Balance training    PT Goals (Current goals can be found in the Care Plan section)  Acute Rehab PT Goals Patient Stated Goal: determine prognosis (new mets just found today), sister is open to hospice/SNF PT Goal Formulation: With family Time For Goal Achievement: 04/18/20 Potential to Achieve Goals: Fair    Frequency Min 2X/week   Barriers to discharge        Co-evaluation               AM-PAC PT "6 Clicks" Mobility  Outcome Measure Help needed turning from your back to your side while in a flat bed without using bedrails?: A Little Help needed moving from lying on your back to sitting on the side of a flat bed without using bedrails?: A Little Help needed moving to and from a bed to a chair (including a wheelchair)?: A Little Help needed standing up from a chair using your arms (e.g., wheelchair or bedside chair)?: A Little Help needed to walk in hospital room?: Total Help needed climbing 3-5 steps with a railing? : Total 6 Click Score: 14    End of Session Equipment Utilized During Treatment: Gait belt;Oxygen Activity Tolerance: Patient limited by fatigue;Patient  limited by lethargy Patient left: in chair;with call bell/phone within reach;with family/visitor present;with chair alarm set;with nursing/sitter in room Nurse Communication: Mobility status PT Visit Diagnosis: Muscle weakness (generalized) (M62.81);Difficulty in walking, not elsewhere classified (R26.2);History of falling (Z91.81)    Time: 5456-2563 PT Time Calculation (min) (ACUTE ONLY): 40 min   Charges:   PT Evaluation $PT Eval Moderate Complexity: 1 Mod PT Treatments $Therapeutic Activity: 23-37 mins        Blondell Reveal Kistler PT 04/04/2020  Acute Rehabilitation Services Pager [phone] Office [phone]

## 2020-04-04 NOTE — Progress Notes (Signed)
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  She is accompanied by her sister whom she lives with.  She has been increasingly fatigued and generally weak resulting in multiple falls recently.  Reportedly saturation at home was 80%.  Also reports shortness of breath and central chest pain that is worse with inspiration and also reported hitting her head after a fall and reporting persistent headaches.  She denies any loss of consciousness, fevers or chills.  Recently he developed bilateral lower extremity swelling.  She has not noticed any orthopnea.  Of note she recently completed antibiotics for urinary tract infection.  On arrival to the emergency room she is found to be afebrile but saturating mid to upper 90s on 3 L of supplemental oxygen with soft blood pressure of 92/60.  Chest x-ray was notable for interval development of left base atelectasis or infiltrate with small pleural effusion.  She is noted to be hypokalemic and had a significantly low albumin at 1.9.  She had an elevated WBC of 21.1 and a microcytic anemia.  BMP and lactic acid were normal.  Blood and urine cultures were collected in the emergency room and she was given oral potassium supplementation.  **Interim History She continues to be significantly weak and remains dyspneic and on supplemental oxygen.  CTA of the chest revealed that she had a abnormal mass on the liver and a CT scan of the abdomen pelvis done showed that she has multiple hypodense lesions as well as a cystic lesion in the pancreas.   Patient did not want MRI for further characterization currently.  We have consulted palliative care for further goals of care discussion but her main issue is her respiratory status and will try a little bit of Lasix if her blood pressure tolerates however because it is on the lower side we will hold off for now  Assessment & Plan:   Principal Problem:   Acute respiratory failure with hypoxia ([place]) Active Problems:   Adenocarcinoma of right lung (HCC)   Debility   Macrocytic anemia   Hypokalemia   Leg swelling   COPD (chronic obstructive pulmonary disease) with emphysema (HCC)   Bipolar disorder (HCC)   Hypothyroid   Chest pain   Peripheral edema   Malnutrition of moderate degree  Acute hypoxic respiratory failureadding of acute PE as well bilateral pleural effusions with right being worse than left with compressive atelectasis worse on the left compared to right -Presents with SOB, general weakness, falls, fatigue, and reports oxygen saturation 80% at home -There is LLL atelectasis vs infection on CXR with small effusion but CTA shows bilateral pleural effusions - She is now reporting pleuritic chest pain  -SpO2: 93 % O2 Flow Rate (L/min): 3 L/min - BNP is normal at 80.3, no wheezing -Check d-dimer was elevated to 7.08 and procalcitonin was 0.41, culture sputum, check strep pneumo and legionella antigens,  -She was initiated Rocpehin and azithromycin for now and will continue for now still -Started incentive spirometry, and continue supplemental O2 as needed -We will check a CTA of the chest to rule out PE and  also extremity Dopplers given her lower leg swelling. -CTA of the chest showed "Single small pulmonary embolus in a lingular pulmonary artery branch.  BILATERAL pleural effusions LEFT greater than RIGHT with compressive atelectasis of BILATERAL lower lobes, greater on LEFT. Interval resection of previously identified RIGHT upper lobe nodule. Mass medial RIGHT lobe liver  3.0 x 2.1 cm question metastatic disease. Suspected enlarged periportal lymph node. Aortic Atherosclerosis." -Lower extremity venous duplex also shows an acute right leg DVT and CT skin of the abdomen pelvis shows "Acute to subacute thrombosis of the portal vein, SMV, and splenic vein. Cystic mass within the pancreatic body, neoplasm cannot be excluded. Dedicated pancreatic MRI may be useful. 3. Multiple hypodense lesions within the right lobe liver, largest measuring 2.8 x 2.2 cm. Metastatic disease cannot be excluded. MRI may be useful for further evaluation. 4. Lymphadenopathy at the root of the mesentery, most pronounced along the SMA/SMV distribution. Metastatic disease is suspected. 5. Diffuse colonic wall thickening compatible with inflammatory or infectious colitis. 6. Trace ascites. 7. Stable bilateral pleural effusions and bibasilar consolidation, left greater than right. 8.  Aortic Atherosclerosis"  -We will start the patient on full dose Lovenox given her history of cancer -Procalcitonin level was 0.41 and trended down to 0.24 -Attic acid level was 1.5 -May benefit from also obtain an echocardiogram  Likely liver metastasis -CT of the chest shows mass in the medial right lobe liver is 3.0 x 2.1 cm -CT abdomen pelvis shows multiple hypodense lesions in the right lower lobe with the largest measuring 2.8 x 2.2 cm with metastatic disease not ruled out -Discussed with oncology and Dr. Alvy Bimler recommends stabilizing patient and having the patient follow-up with her primary oncologist for further evaluation  Suspected pancreatic mass/neoplasm -CTA of the abdomen pelvis showed cystic mass within the pancreatic body -Discussed with patient about getting MRI she does not want to pursue at this time  Debility, falls -Presents with SOB, general weakness, fatigue, and recurrent falls at home -She is somnolent, has mild dysarthria but no definite focal deficit -Check head CT and showed "No  acute intracranial abnormalities. 2. Mild chronic small vessel ischemic change and age advanced brain atrophy" -CHeck, TSH was 11.124 and will check a free T4, ammonia, B12 was 4497, and folate was 14.7,  -continue supportive care, consult PT  Lung cancer -Stage IIB adenocarcinoma right lung status post right upper lobectomy in November 2021, follows with Dr. Georgiann Cocker in [place], now on adjuvant chemotherapy with carboplatin/pemetrexed -Plan a CTA of the chest to rule out PE and evaluate as well but likely shows metastasis in the liver as well as inflammatory nodules and possible mass in the pancreas -We will initiate palliative care goals of care discussion given that the patient is still full code and palliative consulted  Chest Pain -Patient reports central chest pain, worse with inspiration -No acute ischemic features noted on EKG -Check d-dimer 7.08 and troponin was 8, continue cardiac monitoring -If necessary will consider obtaining echocardiogram will hold off for now  COPD -No wheezing on admission -Continue inhalersand remains on supplemental oxygen due to her PE and likely bilateral pleural effusions and compressive atelectasis -Requiring supplemental oxygen via nasal cannula as above and wean as tolerated  Hypokalemia -Serum potassium is 2.9on admission and repeat is now 3.5 -Replace, repeat chemistry panel  Hypothyroidism -Checking TSH as above and was 11.124, follow-up pharmacy medication reconciliation -Check free T4 and T3 and this is pending  Bipolar Disorder -Plan to continue home medications pending  pharmacy medication reconciliation   Leg swelling  - Symmetric pitting edema to b/l LEs with normal BNP and no known hx of CHF  - Likely related to low albumin (1.9) but will need to rule out VTE and obtain lower extremity duplex given elevated D-dimer -Necessary will consider obtaining echocardiogram - Elevated legs, consult  dietary   Macrocytic Anemia  - Hgb is 8.6 on admission, up from 7.2 three weeks ago  - FOBT is negative, likely related to chemotherapy - Check B12 and was 4497 and folate was 14.7, transfuse as needed  Malnutrition of Moderate Degree -Nutritionist consulted for further evaluation recommendations X-continue Ensure Enlive p.o. twice daily as well as multivitamin with minerals daily  DVT prophylaxis: Anticoagulated with Lovenox shots Code Status: FULL CODE  Family Communication: Discussed with Sister at bedside  Disposition Plan: Pending further clinical improvement   Status is: Inpatient  Remains inpatient appropriate because:Unsafe d/c plan, IV treatments appropriate due to intensity of illness or inability to take PO and Inpatient level of care appropriate due to severity of illness   Dispo: The patient is from: Home              Anticipated d/c is to: Home              Patient currently is not medically stable to d/c.   Difficult to place patient No  Consultants:   Palliative Care Medicine  Discussed with Oncology Dr. Alvy Bimler   Procedures:  LE VENOUS DUPLEX RIGHT:  - Findings consistent with acute superficial vein thrombosis involving the  right small saphenous vein.  - There is no evidence of deep vein thrombosis in the lower extremity.    - No cystic structure found in the popliteal fossa.    LEFT:  - There is no evidence of deep vein thrombosis in the lower extremity.    - No cystic structure found in the popliteal fossa.     *See table(s) above for measurements and observations.   Antimicrobials:  Anti-infectives (From admission, onward)   Start     Dose/Rate Route Frequency Ordered Stop   04/03/20 0800  cefTRIAXone (ROCEPHIN) 2 g in sodium chloride 0.9 % 100 mL IVPB        2 g 200 mL/hr over 30 Minutes Intravenous Every 24 hours 04/03/20 0624 04/08/20 0759   04/03/20 0800  azithromycin (ZITHROMAX) 500 mg in sodium chloride 0.9 % 250 mL IVPB         500 mg 250 mL/hr over 60 Minutes Intravenous Every 24 hours 04/03/20 0624 04/08/20 0759        Subjective: Seen and examined at bedside and she is about to work with therapy and was significantly weak and still short of breath.  She had some mild chest pain but no nausea or vomiting.  Denies any lightheadedness or dizziness.  No other concerns or complaints this time but remains significantly fatigued and unstable on her feet.  Objective: Vitals:   04/04/20 0555 04/04/20 0834 04/04/20 1056 04/04/20 1324  BP: (!) 109/58  93/72 96/67  Pulse: 94  100 95  Resp: 16  15 15   Temp: 98.6 F (37 C)  97.6 F (36.4 C) 97.6 F (36.4 C)  TempSrc: Oral  Oral Oral  SpO2: 93% 94% 91% 93%  Weight:      Height:        Intake/Output Summary (Last 24 hours) at 04/04/2020 1732 Last data filed at 04/04/2020 1311 Gross per 24 hour  Intake 860  ml  Output 650 ml  Net 210 ml   Filed Weights   04/03/20 0243 04/03/20 0556  Weight: 61.2 kg 56.5 kg   Examination: Physical Exam:  Constitutional: Patient is a thin chronically ill-appearing Caucasian female currently in mild distress Eyes: Lids and conjunctivae normal, sclerae anicteric  ENMT: External Ears, Nose appear normal. Grossly normal hearing. Neck: Appears normal, supple, no cervical masses, normal ROM, no appreciable thyromegaly; no appreciable JVD Respiratory: Diminished to auscultation bilaterally with coarse breath sounds and some crackles bilaterally.  Has some rhonchi as well.  Has a slightly increased respiratory effort and she is wearing supplemental oxygen via nasal cannula, Cardiovascular: RRR, no murmurs / rubs / gallops. S1 and S2 auscultated.  Has 1+ lower extremity edema worse in the right compared to left Abdomen: Soft, non-tender, non-distended. Bowel sounds positive x4.  GU: Deferred. Musculoskeletal: No clubbing / cyanosis of digits/nails. No joint deformity upper and lower extremities.  Skin: No rashes, lesions, ulcers on  limited skin evaluation. No induration; Warm and dry.  Neurologic: CN 2-12 grossly intact with no focal deficits. Romberg sign and cerebellar reflexes not assessed.  Psychiatric: Normal judgment and insight. Alert and oriented x 3.  Anxious and has a depressed appearing mood and affect  Data Reviewed: I have personally reviewed following labs and imaging studies  CBC: Recent Labs  Lab 04/03/20 0157 04/04/20 0732  WBC 21.1* 17.4*  NEUTROABS 15.8* 12.8*  HGB 8.6* 7.1*  HCT 27.8* 23.7*  MCV 108.6* 110.2*  PLT 266 703   Basic Metabolic Panel: Recent Labs  Lab 04/03/20 0157 04/03/20 0701 04/04/20 0732  NA 135  --  134*  K 2.9*  --  3.5  CL 98  --  100  CO2 25  --  24  GLUCOSE 86  --  69*  BUN 18  --  14  CREATININE 0.82  --  0.62  CALCIUM 7.7*  --  7.3*  MG  --  2.1 2.0  PHOS  --   --  2.8   GFR: Estimated Creatinine Clearance: 60.6 mL/min (by C-G formula based on SCr of 0.62 mg/dL). Liver Function Tests: Recent Labs  Lab 04/03/20 0157 04/04/20 0732  AST 25 20  ALT 17 14  ALKPHOS 121 93  BILITOT 0.5 0.7  PROT 5.8* 4.6*  ALBUMIN 1.9* 1.4*   No results for input(s): LIPASE, AMYLASE in the last 168 hours. Recent Labs  Lab 04/03/20 0753  AMMONIA 23   Coagulation Profile: Recent Labs  Lab 04/03/20 0157 04/03/20 1503  INR 2.3* 2.0*   Cardiac Enzymes: No results for input(s): CKTOTAL, CKMB, CKMBINDEX, TROPONINI in the last 168 hours. BNP (last 3 results) No results for input(s): PROBNP in the last 8760 hours. HbA1C: No results for input(s): HGBA1C in the last 72 hours. CBG: Recent Labs  Lab 04/03/20 0240  GLUCAP 101*   Lipid Profile: No results for input(s): CHOL, HDL, LDLCALC, TRIG, CHOLHDL, LDLDIRECT in the last 72 hours. Thyroid Function Tests: Recent Labs    04/03/20 0701  TSH 11.124*   Anemia Panel: Recent Labs    04/03/20 0701  VITAMINB12 4,497*  FOLATE 14.7   Sepsis Labs: Recent Labs  Lab 04/03/20 0157 04/03/20 0701  04/04/20 0732  PROCALCITON  --  0.41 0.24  LATICACIDVEN 1.5  --   --     Recent Results (from the past 240 hour(s))  Blood culture (routine single)     Status: None (Preliminary result)   Collection Time: 04/03/20  1:57  AM   Specimen: BLOOD  Result Value Ref Range Status   Specimen Description   Final    BLOOD BLOOD LEFT FOREARM Performed at [facility] [address] [place], [place] [address]    Special Requests   Final    BOTTLES DRAWN AEROBIC AND ANAEROBIC Blood Culture results may not be optimal due to an inadequate volume of blood received in culture bottles Performed at [facility] [address] [place], [place] [address]    Culture   Final    NO GROWTH 1 DAY Performed at [facility], [place] [address] [place], [place] [address]    Report Status PENDING  Incomplete    RN Pressure Injury Documentation:     Estimated body mass index is 22.78 kg/m as calculated from the following:   Height as of this encounter: 5\' 2"  (1.575 m).   Weight as of this encounter: 56.5 kg.  Malnutrition Type:  Nutrition Problem: Moderate Malnutrition Etiology: cancer and cancer related treatments,chronic illness  Malnutrition Characteristics:  Signs/Symptoms: energy intake < or equal to 75% for > or equal to 1 month,moderate fat depletion,moderate muscle depletion  Nutrition Interventions:  Interventions: Ensure Enlive (each supplement provides 350kcal and 20 grams of protein),MVI   Radiology Studies: CT HEAD WO CONTRAST  Result Date: 04/03/2020 CLINICAL DATA:  Head trauma. Abnormal mental status. Fell hitting head on cement. EXAM: CT HEAD WITHOUT CONTRAST TECHNIQUE: Contiguous axial images were obtained from the base of the skull through the vertex without intravenous contrast. COMPARISON:  None. FINDINGS: Brain: No evidence of acute infarction, hemorrhage, hydrocephalus, extra-axial collection or mass lesion/mass effect. There is mild  diffuse low-attenuation within the subcortical and periventricular white matter compatible with chronic microvascular disease. Prominence of the sulci and ventricles compatible with brain atrophy. Vascular: No hyperdense vessel or unexpected calcification. Skull: Normal. Negative for fracture or focal lesion. Sinuses/Orbits: No acute finding. Other: None IMPRESSION: 1. No acute intracranial abnormalities. 2. Mild chronic small vessel ischemic change and age advanced brain atrophy. Electronically Signed   By: Kerby Moors M.D.   On: 04/03/2020 10:29   CT ANGIO CHEST PE W OR WO CONTRAST  Result Date: 04/03/2020 CLINICAL DATA:  Chest pain, shortness of breath EXAM: CT ANGIOGRAPHY CHEST WITH CONTRAST TECHNIQUE: Multidetector CT imaging of the chest was performed using the standard protocol during bolus administration of intravenous contrast. Multiplanar CT image reconstructions and MIPs were obtained to evaluate the vascular anatomy. CONTRAST:  56mL OMNIPAQUE IOHEXOL 350 MG/ML SOLN IV COMPARISON:  CT chest 10/26/2019 FINDINGS: Cardiovascular: Atherosclerotic calcifications aorta and proximal great vessels. Aorta normal caliber without aneurysm or dissection. Heart unremarkable. Minimal pericardial effusion. Pulmonary arteries adequately opacified. Pulmonary embolus identified in a lingular pulmonary artery branch. No additional pulmonary emboli identified. RIGHT jugular Port-A-Cath, tip in SVC. Mediastinum/Nodes: Base of cervical region normal appearance. Esophagus unremarkable. No thoracic adenopathy. Lungs/Pleura: BILATERAL pleural effusions LEFT greater than RIGHT. Compressive atelectasis of BILATERAL lower lobes, greater on LEFT. Additional atelectasis in lingula. Interval resection of previously identified RIGHT upper lobe nodule. No new pulmonary mass/nodule identified. Upper Abdomen: Mass medial RIGHT lobe liver 3.0 x 2.1 cm question metastatic disease. Cannot exclude additional mass at periportal region.  Suspected enlarged periportal lymph node 15 mm short axis image 99. Mild perihepatic ascites. Musculoskeletal: No acute osseous lesions. Review of the MIP images confirms the above findings. IMPRESSION: Single small pulmonary embolus in a lingular pulmonary artery branch. BILATERAL pleural effusions LEFT greater than RIGHT with compressive  atelectasis of BILATERAL lower lobes, greater on LEFT. Interval resection of previously identified RIGHT upper lobe nodule. Mass medial RIGHT lobe liver 3.0 x 2.1 cm question metastatic disease. Suspected enlarged periportal lymph node. Aortic Atherosclerosis (ICD10-I70.0). Electronically Signed   By: Lavonia Dana M.D.   On: 04/03/2020 13:47   CT ABDOMEN PELVIS W CONTRAST  Result Date: 04/03/2020 CLINICAL DATA:  History of right upper lobe lung cancer, liver mass on recent chest CT, weakness, fatigue EXAM: CT ABDOMEN AND PELVIS WITH CONTRAST TECHNIQUE: Multidetector CT imaging of the abdomen and pelvis was performed using the standard protocol following bolus administration of intravenous contrast. CONTRAST:  59mL OMNIPAQUE IOHEXOL 300 MG/ML  SOLN COMPARISON:  04/03/2020 FINDINGS: Lower chest: Bilateral pleural effusions and lower lobe atelectasis unchanged since chest CT performed earlier today. The lingular pulmonary embolus seen previously is not as well visualized on this exam. Hepatobiliary: 2.8 x 2.2 cm right lobe liver hypodensity remains indeterminate on this single phase exam. Multiple other subcentimeter hypodensities are seen elsewhere in the right lobe, too small to characterize. No biliary dilation. Gallbladder is unremarkable. Pancreas: There is a cystic mass within the dorsum of the pancreatic body measuring 2.8 x 1.7 cm image 31/2. The remainder of the pancreas appears unremarkable. Spleen: Normal in size without focal abnormality. Adrenals/Urinary Tract: The adrenal glands appear unremarkable. The kidneys enhance normally and symmetrically. Excreted contrast  from previously performed CT chest limit the evaluation for nonobstructing renal calculi. The bladder is unremarkable. Stomach/Bowel: There is diffuse colonic wall thickening consistent with inflammatory or infectious colitis. No bowel obstruction or ileus. Vascular/Lymphatic: There is complete thrombosis of the portal vein, splenic vein, and SMV. Surrounding fat stranding and the lack of significant collaterals suggests acute to subacute thrombosis. There is extensive atherosclerosis throughout the aorta and its branches. Multiple enlarged lymph nodes are seen at the root of the mesentery. Largest lymph node conglomerate interposed between the SMA and SMV on image 37/2 measures 15 mm in short axis. There are numerous other subcentimeter lymph nodes at the celiac axis and at the porta hepatis. Reproductive: Status post hysterectomy. No adnexal masses. Other: There is trace free fluid. No free intraperitoneal gas. No abdominal wall hernia. Musculoskeletal: No acute or destructive bony lesions. Reconstructed images demonstrate no additional findings. IMPRESSION: 1. 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 2. Cystic mass within the pancreatic body, neoplasm cannot be excluded. Dedicated pancreatic MRI may be useful. 3. Multiple hypodense lesions within the right lobe liver, largest measuring 2.8 x 2.2 cm. Metastatic disease cannot be excluded. MRI may be useful for further evaluation. 4. Lymphadenopathy at the root of the mesentery, most pronounced along the SMA/SMV distribution. Metastatic disease is suspected. 5. Diffuse colonic wall thickening compatible with inflammatory or infectious colitis. 6. Trace ascites. 7. Stable bilateral pleural effusions and bibasilar consolidation, left greater than right. 8.  Aortic Atherosclerosis (ICD10-I70.0). These results will be called to the ordering clinician or representative by the Radiologist Assistant, and communication documented in the PACS  or [employer]. Electronically Signed   By: Randa Ngo M.D.   On: 04/03/2020 20:27   DG CHEST PORT 1 VIEW  Result Date: 04/04/2020 CLINICAL DATA:  Shortness of breath EXAM: PORTABLE CHEST 1 VIEW COMPARISON:  Portable exam 0527 hours compared to 04/03/2020 FINDINGS: RIGHT jugular Port-A-Cath with tip projecting over SVC. Rotated to the RIGHT. Normal heart size, mediastinal contours, and pulmonary vascularity. Persistent LEFT pleural effusion and basilar atelectasis. Minimal RIGHT basilar atelectasis. No  segmental consolidation, pneumothorax, or acute osseous findings. IMPRESSION: Persistent LEFT pleural effusion and mild bibasilar atelectasis LEFT greater than RIGHT. Electronically Signed   By: Lavonia Dana M.D.   On: 04/04/2020 08:23   DG Chest Port 1 View  Result Date: 04/03/2020 CLINICAL DATA:  Sepsis EXAM: PORTABLE CHEST 1 VIEW COMPARISON:  CT 10/26/2019 FINDINGS: Small left pleural effusion has developed with associated left basilar atelectasis or infiltrate. Minimal right basilar atelectasis. No pneumothorax. Cardiac size within normal limits. Right internal jugular chest port is seen with its tip within the superior vena cava. Pulmonary vascularity is normal. No acute bone abnormality. IMPRESSION: Interval development of left basilar atelectasis or infiltrate with associated small left pleural effusion. Electronically Signed   By: Fidela Salisbury MD   On: 04/03/2020 02:15   VAS [place] LOWER EXTREMITY VENOUS (DVT)  Result Date: 04/03/2020  Lower Venous DVT Study Indications: Elevated Ddimer.  Risk Factors: Cancer. Comparison Study: No prior studies. Performing Technologist: Oliver Hum RVT  Examination Guidelines: A complete evaluation includes B-mode imaging, spectral Doppler, color Doppler, and power Doppler as needed of all accessible portions of each vessel. Bilateral testing is considered an integral part of a complete examination. Limited examinations for reoccurring indications may be  performed as noted. The reflux portion of the exam is performed with the patient in reverse Trendelenburg.  +---------+---------------+---------+-----------+----------+--------------+ RIGHT    CompressibilityPhasicitySpontaneityPropertiesThrombus Aging +---------+---------------+---------+-----------+----------+--------------+ CFV      Full           Yes      Yes                                 +---------+---------------+---------+-----------+----------+--------------+ SFJ      Full                                                        +---------+---------------+---------+-----------+----------+--------------+ FV Prox  Full                                                        +---------+---------------+---------+-----------+----------+--------------+ FV Mid   Full                                                        +---------+---------------+---------+-----------+----------+--------------+ FV DistalFull                                                        +---------+---------------+---------+-----------+----------+--------------+ PFV      Full                                                        +---------+---------------+---------+-----------+----------+--------------+  POP      Full           Yes      Yes                                 +---------+---------------+---------+-----------+----------+--------------+ PTV      Full                                                        +---------+---------------+---------+-----------+----------+--------------+ PERO     Full                                                        +---------+---------------+---------+-----------+----------+--------------+ SSV      None                                         Acute          +---------+---------------+---------+-----------+----------+--------------+   +---------+---------------+---------+-----------+----------+--------------+ LEFT      CompressibilityPhasicitySpontaneityPropertiesThrombus Aging +---------+---------------+---------+-----------+----------+--------------+ CFV      Full           Yes      Yes                                 +---------+---------------+---------+-----------+----------+--------------+ SFJ      Full                                                        +---------+---------------+---------+-----------+----------+--------------+ FV Prox  Full                                                        +---------+---------------+---------+-----------+----------+--------------+ FV Mid   Full                                                        +---------+---------------+---------+-----------+----------+--------------+ FV DistalFull                                                        +---------+---------------+---------+-----------+----------+--------------+ PFV      Full                                                        +---------+---------------+---------+-----------+----------+--------------+  POP      Full           Yes      Yes                                 +---------+---------------+---------+-----------+----------+--------------+ PTV      Full                                                        +---------+---------------+---------+-----------+----------+--------------+ PERO     Full                                                        +---------+---------------+---------+-----------+----------+--------------+     Summary: RIGHT: - Findings consistent with acute superficial vein thrombosis involving the right small saphenous vein. - There is no evidence of deep vein thrombosis in the lower extremity.  - No cystic structure found in the popliteal fossa.  LEFT: - There is no evidence of deep vein thrombosis in the lower extremity.  - No cystic structure found in the popliteal fossa.  *See table(s) above for measurements and observations.  Electronically signed by Servando Snare MD on 04/03/2020 at 5:55:16 PM.    Final    Scheduled Meds: . Chlorhexidine Gluconate Cloth  6 each Topical Daily  . enoxaparin (LOVENOX) injection  60 mg Subcutaneous Q12H  . feeding supplement  237 mL Oral BID BM  . heparin lock flush  500 Units Intravenous Once  . mometasone-formoterol  2 puff Inhalation BID  . multivitamin with minerals  1 tablet Oral Daily  . sodium chloride flush  10-40 mL Intracatheter Q12H   Continuous Infusions: . azithromycin 500 mg (04/04/20 0847)  . cefTRIAXone (ROCEPHIN)  IV 2 g (04/04/20 0809)    LOS: 1 day   Kerney Elbe, DO [facility] PAGER is on [place]  If 7PM-7AM, please contact night-coverage [email or web address]

## 2020-04-05 ENCOUNTER — Inpatient Hospital Stay (HOSPITAL_COMMUNITY): Payer: Medicare HMO

## 2020-04-05 DIAGNOSIS — E039 Hypothyroidism, unspecified: Secondary | ICD-10-CM

## 2020-04-05 DIAGNOSIS — R531 Weakness: Secondary | ICD-10-CM | POA: Diagnosis not present

## 2020-04-05 DIAGNOSIS — F319 Bipolar disorder, unspecified: Secondary | ICD-10-CM | POA: Diagnosis not present

## 2020-04-05 DIAGNOSIS — Z515 Encounter for palliative care: Secondary | ICD-10-CM | POA: Diagnosis not present

## 2020-04-05 DIAGNOSIS — D539 Nutritional anemia, unspecified: Secondary | ICD-10-CM

## 2020-04-05 DIAGNOSIS — Z7189 Other specified counseling: Secondary | ICD-10-CM | POA: Diagnosis not present

## 2020-04-05 DIAGNOSIS — C3491 Malignant neoplasm of unspecified part of right bronchus or lung: Secondary | ICD-10-CM | POA: Diagnosis not present

## 2020-04-05 DIAGNOSIS — J9601 Acute respiratory failure with hypoxia: Secondary | ICD-10-CM

## 2020-04-05 DIAGNOSIS — J439 Emphysema, unspecified: Secondary | ICD-10-CM | POA: Diagnosis not present

## 2020-04-05 DIAGNOSIS — E440 Moderate protein-calorie malnutrition: Secondary | ICD-10-CM

## 2020-04-05 LAB — COMPREHENSIVE METABOLIC PANEL
ALT: 16 U/L (ref 0–44)
AST: 28 U/L (ref 15–41)
Albumin: 1.7 g/dL — ABNORMAL LOW (ref 3.5–5.0)
Alkaline Phosphatase: 100 U/L (ref 38–126)
Anion gap: 9 (ref 5–15)
BUN: 13 mg/dL (ref 6–20)
CO2: 25 mmol/L (ref 22–32)
Calcium: 7.7 mg/dL — ABNORMAL LOW (ref 8.9–10.3)
Chloride: 100 mmol/L (ref 98–111)
Creatinine, Ser: 0.6 mg/dL (ref 0.44–1.00)
GFR, Estimated: >60 mL/min (ref >60)
Glucose, Bld: 106 mg/dL — ABNORMAL HIGH (ref 70–99)
Potassium: 4 mmol/L (ref 3.5–5.1)
Sodium: 134 mmol/L — ABNORMAL LOW (ref 135–145)
Total Bilirubin: 0.9 mg/dL (ref 0.3–1.2)
Total Protein: 5 g/dL — ABNORMAL LOW (ref 6.5–8.1)

## 2020-04-05 LAB — CBC WITH DIFFERENTIAL/PLATELET
Abs Immature Granulocytes: 0.5 10*3/uL — ABNORMAL HIGH (ref 0.00–0.07)
Basophils Absolute: 0.2 10*3/uL — ABNORMAL HIGH (ref 0.0–0.1)
Basophils Relative: 1 %
Eosinophils Absolute: 0 10*3/uL (ref 0.0–0.5)
Eosinophils Relative: 0 %
HCT: 26.7 % — ABNORMAL LOW (ref 36.0–46.0)
Hemoglobin: 7.9 g/dL — ABNORMAL LOW (ref 12.0–15.0)
Immature Granulocytes: 2 %
Lymphocytes Relative: 10 %
Lymphs Abs: 2.6 10*3/uL (ref 0.7–4.0)
MCH: 32.9 pg (ref 26.0–34.0)
MCHC: 29.6 g/dL — ABNORMAL LOW (ref 30.0–36.0)
MCV: 111.3 fL — ABNORMAL HIGH (ref 80.0–100.0)
Monocytes Absolute: 1.6 10*3/uL — ABNORMAL HIGH (ref 0.1–1.0)
Monocytes Relative: 6 %
Neutro Abs: 20.8 10*3/uL — ABNORMAL HIGH (ref 1.7–7.7)
Neutrophils Relative %: 81 %
Platelets: 279 10*3/uL (ref 150–400)
RBC: 2.4 MIL/uL — ABNORMAL LOW (ref 3.87–5.11)
RDW: 23.9 % — ABNORMAL HIGH (ref 11.5–15.5)
WBC: 25.7 10*3/uL — ABNORMAL HIGH (ref 4.0–10.5)
nRBC: 0.1 % (ref 0.0–0.2)

## 2020-04-05 LAB — T4, FREE: Free T4: 0.64 ng/dL (ref 0.61–1.12)

## 2020-04-05 LAB — MAGNESIUM: Magnesium: 1.9 mg/dL (ref 1.7–2.4)

## 2020-04-05 LAB — PHOSPHORUS: Phosphorus: 2.7 mg/dL (ref 2.5–4.6)

## 2020-04-05 LAB — PROCALCITONIN: Procalcitonin: 0.47 ng/mL

## 2020-04-05 LAB — MRSA PCR SCREENING: MRSA by PCR: NEGATIVE

## 2020-04-05 LAB — LEGIONELLA PNEUMOPHILA SEROGP 1 UR AG: L. pneumophila Serogp 1 Ur Ag: NEGATIVE

## 2020-04-05 MED ORDER — VANCOMYCIN HCL 1250 MG/250ML IV SOLN
1250.0000 mg | Freq: Once | INTRAVENOUS | Status: AC
  Administered 2020-04-05: 1250 mg via INTRAVENOUS
  Filled 2020-04-05: qty 250

## 2020-04-05 MED ORDER — SERTRALINE HCL 100 MG PO TABS
150.0000 mg | ORAL_TABLET | Freq: Every day | ORAL | Status: DC
  Administered 2020-04-05 – 2020-04-08 (×4): 150 mg via ORAL
  Filled 2020-04-05 (×4): qty 1

## 2020-04-05 MED ORDER — VANCOMYCIN HCL 1000 MG/200ML IV SOLN: 1000.0000 mg | INTRAVENOUS | Status: DC

## 2020-04-05 MED ORDER — DIVALPROEX SODIUM ER 500 MG PO TB24
1000.0000 mg | ORAL_TABLET | Freq: Every day | ORAL | Status: DC
  Administered 2020-04-05 – 2020-04-07 (×3): 1000 mg via ORAL
  Filled 2020-04-05 (×3): qty 2

## 2020-04-05 MED ORDER — ALBUTEROL SULFATE HFA 108 (90 BASE) MCG/ACT IN AERS: 1.0000 | INHALATION_SPRAY | RESPIRATORY_TRACT | Status: DC | PRN

## 2020-04-05 MED ORDER — VALBENAZINE TOSYLATE 80 MG PO CAPS: 80.0000 mg | ORAL_CAPSULE | Freq: Every day | ORAL | Status: DC

## 2020-04-05 MED ORDER — IPRATROPIUM-ALBUTEROL 0.5-2.5 (3) MG/3ML IN SOLN
3.0000 mL | Freq: Four times a day (QID) | RESPIRATORY_TRACT | Status: DC
  Administered 2020-04-05 – 2020-04-06 (×2): 3 mL via RESPIRATORY_TRACT
  Filled 2020-04-05 (×2): qty 3

## 2020-04-05 MED ORDER — VALBENAZINE TOSYLATE 40 MG PO CAPS
80.0000 mg | ORAL_CAPSULE | Freq: Every day | ORAL | Status: DC
  Administered 2020-04-05 – 2020-04-07 (×3): 80 mg via ORAL
  Filled 2020-04-05 (×3): qty 2

## 2020-04-05 MED ORDER — OLANZAPINE 5 MG PO TABS
20.0000 mg | ORAL_TABLET | Freq: Every day | ORAL | Status: DC
  Administered 2020-04-05 – 2020-04-07 (×3): 20 mg via ORAL
  Filled 2020-04-05 (×3): qty 4

## 2020-04-05 MED ORDER — PIPERACILLIN-TAZOBACTAM 3.375 G IVPB
3.3750 g | Freq: Three times a day (TID) | INTRAVENOUS | Status: DC
  Administered 2020-04-05 – 2020-04-06 (×4): 3.375 g via INTRAVENOUS
  Filled 2020-04-05 (×6): qty 50

## 2020-04-05 MED ORDER — TRAZODONE HCL 50 MG PO TABS
100.0000 mg | ORAL_TABLET | Freq: Every day | ORAL | Status: DC
  Administered 2020-04-05 – 2020-04-07 (×3): 100 mg via ORAL
  Filled 2020-04-05 (×3): qty 2

## 2020-04-05 MED ORDER — BUPROPION HCL ER (XL) 150 MG PO TB24
150.0000 mg | ORAL_TABLET | Freq: Every morning | ORAL | Status: DC
  Administered 2020-04-06 – 2020-04-08 (×3): 150 mg via ORAL
  Filled 2020-04-05 (×3): qty 1

## 2020-04-05 NOTE — Progress Notes (Signed)
Pharmacy Antibiotic Note  April Huff is a 60 y.o. female admitted on 04/03/2020 with pneumonia.  Pharmacy has been consulted for vancomycin + piperacillin/tazobactam dosing.  Pt with PMH significant for COPD, lung cancer on chemotherapy. Currently on day #3 of IV antibiotics with ceftriaxone + azithromycin. Antibiotics being broadened to vancomycin + piperacillin/tazobactam today.   Today, 04/05/20  WBC 25.7 - elevated, increased  SCr WNL and stable.   PCT 0.41 > 0.24 > 0.47  Afebrile  Plan:  Piperacillin/tazobactam 3.375 g IV q8h EI  Vancomycin 1250 mg LD followed by 1000 mg IV q24h  Goal vancomycin AUC 400-550  Follow renal function  MRSA PCR ordered  Height: 5\' 2"  (157.5 cm) Weight: 55.7 kg (122 lb 12.7 oz) IBW/kg (Calculated) : 50.1  Temp (24hrs), Avg:98 F (36.7 C), Min:97.7 F (36.5 C), Max:98.6 F (37 C)  Recent Labs  Lab 04/03/20 0157 04/04/20 0732 04/05/20 0507  WBC 21.1* 17.4* 25.7*  CREATININE 0.82 0.62 0.60  LATICACIDVEN 1.5  --   --     Estimated Creatinine Clearance: 60.6 mL/min (by C-G formula based on SCr of 0.6 mg/dL).    Allergies  Allergen Reactions  . Nitrofurantoin   . Sulfa Antibiotics     Antimicrobials this admission: vancomycin 3/2 >>  Piperacillin/tazobactam 3/2 >>  Ceftriaxone 2/28 > 3/2 Azithromycin 2/28 > 3/2  Dose adjustments this admission:  Microbiology results: 2/28 BCx: ngtd 3/2 MRSA PCR:   Thank you for allowing pharmacy to be a part of this patient's care.  Lenis Noon, PharmD 04/05/2020 4:33 PM

## 2020-04-05 NOTE — Consult Note (Signed)
Consultation Note Date: 04/05/2020   Patient Name: April Huff  DOB: [birth year]  MRN: [id number]  Age / Sex: 60 y.o., female   PCP: Juanda Chance Referring Physician: Tawni Millers  Reason for Consultation: Establishing goals of care and Psychosocial/spiritual support  Palliative Care Assessment and Plan Summary of Established Goals of Care and Medical Treatment Preferences   Clinical Assessment/Narrative: April Huff is a 60 y/o woman with history of COPD, Bipolar disease, hypothyroidism, and lung cancer diagnosed in November of last year s/p right upper lobe lobectomy and is now on adjuvant chemo. Her oncologist is Dr. Georgiann Cocker in [place]. She was admitted to the hospital with progressive weakness, recurrent falls and shortness of breath. Admitted with acute hypoxic respiratory failure which is likely multifactorial in the setting of PE found on CT chest, bilateral pulmonary effusions, and possible pneumonia.  Imaging also showed evidence of liver mass suspicious for metastasis, as well as a pancreatic mass.   Palliative care team was consulted to discuss [place] in light of her decline in functional status and disease advancement. April Huff was accompanied by her sister, April Huff, whom she has been living with for the last few months. April Huff was fairly drowsy and allowed for her sister to lead most of the discussion, but did chime in periodically when asked questions.   Prior to living in [place] lived in [place] with her husband. After he passed away from pancreatic cancer last year, she moved to [place] to live with her sister. She has two children, one lives in [place] and the other in [place].   April Huff confesses that she and her sister are both in shock regarding the state of April Huff's clinical condition and how rapidly her condition has progressed. April Huff was able to summarize everything that was going on with April Huff. She endorses that her sister has been on a steady  decline ever since the lobectomy a few months back. She has been trying to get her to be more active and eat more, but it has been quite a struggle.   We introduced palliative medicine as specialized medical care for people living with serious illness. It focuses on providing relief from the symptoms and stress of a serious illness. The goal is to improve quality of life for both the patient and the family.   Goals of care: Broad aims of medical therapy in relation to the patient's values and preferences. Our aim is to provide medical care aimed at enabling patients to achieve the goals that matter most to them, given the circumstances of their particular medical situation and their constraints.  April Huff states the primary goal for her sister at this point is to ensure comfort and optimize quality of life. April Huff indicates she is in agreement with this. We discussed the concept of HCPOA which April Huff has not filled out since moving from [place]. They are interested in chaplain consult to help arrange this.  Also brought up her codes status. April Huff states that she had a DNR filled out with [facility]. We asked April Huff again to ensure these were still her wishes. She would like her code status to be DNR.   April Huff would like to see how April Huff progresses over the next coming days treating any reversible contributing factors to her sister's decline and make subsequent decisions regarding dispo plan and long-term care plan from there.   Contacts/Participants in Discussion: Patient and sister   Code Status/Advance Care Planning:  DNR  Chaplain consulted for April Huff paperwork   Additional  Recommendations (Limitations, Scope, Preferences):  Full scope treatment  Psycho-social/Spiritual:   Support System: sister  Desire for further Chaplaincy support:yes  Prognosis: < 6 months  Discharge Planning:  To Be Determined         Primary Diagnoses  Present on Admission: . Acute respiratory failure with  hypoxia ([place]) . Adenocarcinoma of right lung ([place]) . Debility . Macrocytic anemia . Hypokalemia . Leg swelling . COPD (chronic obstructive pulmonary disease) with emphysema ([place]) . Bipolar disorder ([place]) . Hypothyroid . Chest pain 1.   I have reviewed the medical record, interviewed the patient and family, and examined the patient. The following aspects are pertinent.  Past Medical History:  Diagnosis Date  . lung ca    lobectomy 12/2019   Social History   Socioeconomic History  . Marital status: Widowed    Spouse name: Not on file  . Number of children: Not on file  . Years of education: Not on file  . Highest education Huff: Not on file  Occupational History  . Not on file  Tobacco Use  . Smoking status: Current Every Day Smoker    Packs/day: 1.00    Years: 40.00    Pack years: 40.00    Types: Cigarettes  . Smokeless tobacco: Never Used  Substance and Sexual Activity  . Alcohol use: Not on file  . Drug use: Not on file  . Sexual activity: Not on file  Other Topics Concern  . Not on file  Social History Narrative  . Not on file   Social Determinants of Health   Financial Resource Strain: Not on file  Food Insecurity: Not on file  Transportation Needs: Not on file  Physical Activity: Not on file  Stress: Not on file  Social Connections: Not on file   Family History  Problem Relation Age of Onset  . Thyroid cancer Mother    Scheduled Meds: . Chlorhexidine Gluconate Cloth  6 each Topical Daily  . enoxaparin (LOVENOX) injection  60 mg Subcutaneous Q12H  . feeding supplement  237 mL Oral BID BM  . heparin lock flush  500 Units Intravenous Once  . mometasone-formoterol  2 puff Inhalation BID  . multivitamin with minerals  1 tablet Oral Daily  . sodium chloride flush  10-40 mL Intracatheter Q12H   Continuous Infusions: . azithromycin 500 mg (04/05/20 0953)  . cefTRIAXone (ROCEPHIN)  IV 2 g (04/05/20 0810)   PRN Meds:.acetaminophen **OR**  acetaminophen, albuterol, ondansetron **OR** ondansetron (ZOFRAN) IV, senna-docusate, sodium chloride flush Medications Prior to Admission:  Prior to Admission medications   Medication Sig Start Date End Date Taking? Authorizing Provider  acetaminophen (TYLENOL) 325 MG tablet Take 650 mg by mouth every 6 (six) hours as needed for mild pain, fever or headache.   Yes [provider]  aspirin 81 MG EC tablet Take 81 mg by mouth daily. 02/07/20 02/06/21 Yes [provider]  buPROPion (WELLBUTRIN XL) 150 MG 24 hr tablet Take 150 mg by mouth every morning. 02/01/20  Yes [provider]  chlorproMAZINE (THORAZINE) 25 MG tablet Take 25 mg by mouth 3 (three) times daily as needed for hiccoughs. 03/15/20  Yes [provider]  dexamethasone (DECADRON) 4 MG tablet Take 4 mg by mouth as directed. 4mg  twice daily the day before and the day after chemo 02/14/20  Yes [provider]  divalproex (DEPAKOTE ER) 500 MG 24 hr tablet Take 1,000 mg by mouth at bedtime. 01/26/20  Yes [provider]  INGREZZA 80 MG  CAPS Take 80 mg by mouth at bedtime. 03/02/20  Yes [provider]  lidocaine-prilocaine (EMLA) cream Apply 1 application topically as needed. Port access 02/02/20  Yes [provider]  OLANZapine (ZYPREXA) 20 MG tablet Take 20 mg by mouth at bedtime. 01/26/20  Yes [provider]  ondansetron (ZOFRAN) 4 MG tablet Take 4 mg by mouth every 8 (eight) hours as needed for nausea/vomiting. 03/01/20  Yes [provider]  sertraline (ZOLOFT) 100 MG tablet Take 150 mg by mouth daily. 01/26/20  Yes [provider]  traZODone (DESYREL) 100 MG tablet Take 100 mg by mouth at bedtime. 01/26/20  Yes [provider]  amoxicillin-clavulanate (AUGMENTIN) 875-125 MG tablet Take 1 tablet by mouth 2 (two) times daily. 7 day supply 03/23/20   [provider]   Allergies  Allergen Reactions  . Nitrofurantoin   . Sulfa  Antibiotics    CBC:    Component Value Date/Time   WBC 25.7 (H) 04/05/2020 0507   HGB 7.9 (L) 04/05/2020 0507   HCT 26.7 (L) 04/05/2020 0507   PLT 279 04/05/2020 0507   MCV 111.3 (H) 04/05/2020 0507   NEUTROABS 20.8 (H) 04/05/2020 0507   LYMPHSABS 2.6 04/05/2020 0507   MONOABS 1.6 (H) 04/05/2020 0507   EOSABS 0.0 04/05/2020 0507   BASOSABS 0.2 (H) 04/05/2020 0507   Comprehensive Metabolic Panel:    Component Value Date/Time   NA 134 (L) 04/05/2020 0507   K 4.0 04/05/2020 0507   CL 100 04/05/2020 0507   CO2 25 04/05/2020 0507   BUN 13 04/05/2020 0507   CREATININE 0.60 04/05/2020 0507   GLUCOSE 106 (H) 04/05/2020 0507   CALCIUM 7.7 (L) 04/05/2020 0507   AST 28 04/05/2020 0507   ALT 16 04/05/2020 0507   ALKPHOS 100 04/05/2020 0507   BILITOT 0.9 04/05/2020 0507   PROT 5.0 (L) 04/05/2020 0507   ALBUMIN 1.7 (L) 04/05/2020 0507    Physical Exam: Vital Signs: BP 92/60 (BP Location: Right Arm)   Pulse 98   Temp 97.8 F (36.6 C) (Oral)   Resp 16   Ht 5\' 2"  (1.575 m)   Wt 55.7 kg   SpO2 92%   BMI 22.46 kg/m  SpO2: SpO2: 92 % O2 Device: O2 Device: Nasal Cannula O2 Flow Rate: O2 Flow Rate (L/min): 4 L/min Intake/output summary:   Intake/Output Summary (Last 24 hours) at 04/05/2020 1305 Last data filed at 04/05/2020 0500 Gross per 24 hour  Intake 250 ml  Output 300 ml  Net -50 ml   LBM: Last BM Date: 04/05/20 Baseline Weight: Weight: 61.2 kg Most recent weight: Weight: 55.7 kg  Exam Findings:  General: ill-appearing, lying in bed in NAD Pulm: breathing comfortably on 3L [place]  Neuro: drowsy but arouses to voice, non-focal          Palliative Performance Scale: 40%           Additional Data Reviewed: Recent Labs    04/04/20 0732 04/05/20 0507  WBC 17.4* 25.7*  HGB 7.1* 7.9*  PLT 187 279  NA 134* 134*  BUN 14 13  CREATININE 0.62 0.60     Time In: 10 Time Out: 11 Time Total: 60  Greater than 50%  of this time was spent counseling and coordinating care  related to the above assessment and plan.  Signed by:  Delice Bison, DO  04/05/2020, 1:05 PM  Please contact Palliative Medicine Team phone at [phone] for questions and concerns.   PMT Attending Addendum:  Agree with above note, patient seen and examined, discussed with patient and sister at bedside. 35 yo lady with lung cancer, admitted with acute hypoxic resp failure with acute PE, bilateral pleural effusions. PMT consult for goals of care.  Code status clarified as DNR DNI.  Continue current mode of care.  Ongoing goals of care discussions with patient and sister regarding SNF versus home with hospice support on discharge. PMT to follow.  Rest as above.  Loistine Chance MD [place] palliative.

## 2020-04-05 NOTE — TOC Progression Note (Signed)
Transition of Care [facility]) - Progression Note    Patient Details  Name: April Huff MRN: [id number] Date of Birth: 1960-09-06  [facility]) CM/SW Contact  Leeroy Cha, RN Phone Number: 04/05/2020, 9:30 AM  Clinical Narrative:    [facility].  Will start auth with [employer] medicare.   Expected Discharge Plan: [place] Barriers to Discharge: Continued Medical Work up  Expected Discharge Plan and Services Expected Discharge Plan: [place]   Discharge Planning Services: CM Consult   Living arrangements for the past 2 months: Single Family Home                                       Social Determinants of Health (SDOH) Interventions    Readmission Risk Interventions Readmission Risk Prevention Plan 04/04/2020  Transportation Screening Complete  PCP or Specialist Appt within 3-5 Days Complete  HRI or [place] Complete  Social Work Consult for [place] Planning/Counseling Complete  Palliative Care Screening Not Applicable  Medication Review [employer]) Complete

## 2020-04-05 NOTE — TOC Progression Note (Signed)
Transition of Care [facility]) - Progression Note    Patient Details  Name: April Huff MRN: [id number] Date of Birth: 10/24/1960  Transition of Care [employer]) CM/SW Contact  Leeroy Cha, RN Phone Number: 04/05/2020, 3:02 PM  Clinical Narrative:    [employer] approved snf stay from [id number] [phone] before next review is due. Josem Kaufmann number is [phone] Case workers is Copy.   Expected Discharge Plan: [place] Barriers to Discharge: Continued Medical Work up  Expected Discharge Plan and Services Expected Discharge Plan: [place]   Discharge Planning Services: CM Consult   Living arrangements for the past 2 months: Single Family Home                                       Social Determinants of Health (SDOH) Interventions    Readmission Risk Interventions Readmission Risk Prevention Plan 04/04/2020  Transportation Screening Complete  PCP or Specialist Appt within 3-5 Days Complete  HRI or [place] Complete  Social Work Consult for [place] Planning/Counseling Complete  Palliative Care Screening Not Applicable  Medication Review [employer]) Complete

## 2020-04-05 NOTE — Progress Notes (Addendum)
PROGRESS NOTE    April Huff  ZOX:[id number] DOB: 05-21-1960 DOA: 04/03/2020 PCP: Mindi Curling, PA-C    Brief Narrative:  Mrs. Abbasi was admitted to the hospital with a working diagnosis of acute hypoxic respiratory failure due bilateral pleural effusions, and pulmonary embolism in the setting of lung cancer.   60 year old female past medical history for COPD, hypothyroidism, bipolar disorder and right lung adenocarcinoma status post lobectomy and currently on chemotherapy.  At home patient developed worsening generalized weakness, and multiple falls, associated with dyspnea and oxygen desaturation down to 80%.  Recently diagnosed with urinary tract infection.  On her initial physical examination she was afebrile, blood pressure 92/60, 109/60, heart rate 90, respiratory rate 18, temperature 98.1, oxygen saturation 93% on supplemental oxygen, she had decreased breath sounds bilaterally, heart S1-S2, present rhythm, soft abdomen, positive pitting bilateral strength edema.   Sodium 135, potassium 2.9, chloride 98, bicarb 25, glucose 86, BUN 18, creatinine 0.82, white count 31.1, hemoglobin 8.6, hematocrit 27.8, platelets 266. SARS COVID-19 negative. Urinalysis specific gravity 1.019, negative for nitrates. Head CT negative for acute changes.  Chest radiograph with bilateral pleural effusions more left than right. EKG 90 bpm, normal axis, normal intervals, sinus rhythm, poor R progression, no ST segment or T wave changes.  Patient placed on supplemental oxygen and antibiotic therapy, further work-up with CT chest showed positive pulmonary embolism, bilateral pleural effusions more left than right. Ultrasound lower extremities negative for deep vein thrombosis.  CT of the abdomen with acute to subacute thrombosis of the portal vein.  Multiple liver metastasis.  Palliative care service has been consulted.  Patient with poor prognosis, very weak and deconditioned, her functional  capacity is very low, doubt she is a candidate for further chemotherapy.  Her sister is willing to take patient home with home services when patient stable.   Assessment & Plan:   Principal Problem:   Acute respiratory failure with hypoxia (HCC) Active Problems:   Adenocarcinoma of right lung (HCC)   Debility   Macrocytic anemia   Hypokalemia   Leg swelling   COPD (chronic obstructive pulmonary disease) with emphysema (HCC)   Bipolar disorder (HCC)   Hypothyroid   Chest pain   Peripheral edema   Malnutrition of moderate degree   1. Acute hypoxemic respiratory failure due to bilateral pleural effusions more left than right, and pulmonary embolism in the setting of lung cancer (stage 2B). Possible pneumonia Oxygenation is 94% on 4 L/min per [place]. Dyspnea seems to be controlled, but very weak and deconditioned, limited mobility.   Continue supplemental 02 per [place] to keep oxygen saturation 92% or greater. On bronchodilator therapy albuterol and ipratropium.  Chest imaging with atelectasis but can not rule out pneumonia, wbc continue to be elevated and worsening up to 25.7. Change antibiotic therapy with Zosyn and Vancomycin.  Follow up on cultures, cell count and temperature curve.   Anticoagulation for PE with enoxaparin.   2. COPD. No signs of acute exacerbation, continue bronchodilator therapy and supplemental 02 per [place].  3. Hypokalemia/ urinary retention. Stable renal function with serum cr at 0,60 with k at 4,0 and bicarbonate at 25. Patient with poor oral intake   Today noted urinary retention, placed foley catheter, patient very weak and deconditioned.   4. Anemia of chronic disease. Cell count has been stable 7.1 to 7.9, hold on PRBC transfusion for now.   5. Moderate calorie malnutrition. Continue with nutritional supplements.   6, Superficial vin thrombosis right small saphenous vein. Continue  pain control.   7. Depression. Resume bupropion, divaloprex, olanzapine,  sertraline and trazodone, per her home regimen.  For dyskinesia resume velbenazine.   Patient continue to be at high risk for worsening hypoxemia.   Status is: Inpatient  Remains inpatient appropriate because:Inpatient level of care appropriate due to severity of illness   Dispo: The patient is from: Home              Anticipated d/c is to: Home with hospice in the next 48 hrs.               Patient currently is not medically stable to d/c.   Difficult to place patient No   DVT prophylaxis: Enoxaparin   Code Status:   DNR   Family Communication:  I spoke with patient's sistter at the bedside, we talked in detail about patient's condition, plan of care and prognosis and all questions were addressed.      Nutrition Status: Nutrition Problem: Moderate Malnutrition Etiology: cancer and cancer related treatments,chronic illness Signs/Symptoms: energy intake < or equal to 75% for > or equal to 1 month,moderate fat depletion,moderate muscle depletion Interventions: Ensure Enlive (each supplement provides 350kcal and 20 grams of protein),MVI     Consultants:   Palliative care    Antimicrobials:   vanc and zosyn     Subjective: Patient very weak and deconditioned, no nausea or vomiting, positive dyspnea with minimal efforts   Objective: Vitals:   04/05/20 0500 04/05/20 0639 04/05/20 1145 04/05/20 1404  BP:   92/60 (!) 153/116  Pulse:   98 (!) 101  Resp:   16 15  Temp:   97.8 F (36.6 C) 98.6 F (37 C)  TempSrc:   Oral Oral  SpO2:  94% 92% 96%  Weight: 55.7 kg     Height:        Intake/Output Summary (Last 24 hours) at 04/05/2020 1531 Last data filed at 04/05/2020 0800 Gross per 24 hour  Intake 370 ml  Output 300 ml  Net 70 ml   Filed Weights   04/03/20 0243 04/03/20 0556 04/05/20 0500  Weight: 61.2 kg 56.5 kg 55.7 kg    Examination:   General: deconditioned and ill looking appearing  Neurology: somnolent but easy to arouse E ENT: positive pallor, no  icterus, oral mucosa moist Cardiovascular: No JVD. S1-S2 present, rhythmic, no gallops, rubs, or murmurs. Trace bilateral lower extremity edema. Pulmonary: positive breath sounds bilaterally, decreased inspiratory effort, decreased breath sounds at bases., no wheezing Gastrointestinal. Abdomen mild distended Skin. No rashes Musculoskeletal: no joint deformities     Data Reviewed: I have personally reviewed following labs and imaging studies  CBC: Recent Labs  Lab 04/03/20 0157 04/04/20 0732 04/05/20 0507  WBC 21.1* 17.4* 25.7*  NEUTROABS 15.8* 12.8* 20.8*  HGB 8.6* 7.1* 7.9*  HCT 27.8* 23.7* 26.7*  MCV 108.6* 110.2* 111.3*  PLT 266 187 947   Basic Metabolic Panel: Recent Labs  Lab 04/03/20 0157 04/03/20 0701 04/04/20 0732 04/05/20 0507  NA 135  --  134* 134*  K 2.9*  --  3.5 4.0  CL 98  --  100 100  CO2 25  --  24 25  GLUCOSE 86  --  69* 106*  BUN 18  --  14 13  CREATININE 0.82  --  0.62 0.60  CALCIUM 7.7*  --  7.3* 7.7*  MG  --  2.1 2.0 1.9  PHOS  --   --  2.8 2.7   GFR: Estimated Creatinine  Clearance: 60.6 mL/min (by C-G formula based on SCr of 0.6 mg/dL). Liver Function Tests: Recent Labs  Lab 04/03/20 0157 04/04/20 0732 04/05/20 0507  AST 25 20 28   ALT 17 14 16   ALKPHOS 121 93 100  BILITOT 0.5 0.7 0.9  PROT 5.8* 4.6* 5.0*  ALBUMIN 1.9* 1.4* 1.7*   No results for input(s): LIPASE, AMYLASE in the last 168 hours. Recent Labs  Lab 04/03/20 0753  AMMONIA 23   Coagulation Profile: Recent Labs  Lab 04/03/20 0157 04/03/20 1503  INR 2.3* 2.0*   Cardiac Enzymes: No results for input(s): CKTOTAL, CKMB, CKMBINDEX, TROPONINI in the last 168 hours. BNP (last 3 results) No results for input(s): PROBNP in the last 8760 hours. HbA1C: No results for input(s): HGBA1C in the last 72 hours. CBG: Recent Labs  Lab 04/03/20 0240  GLUCAP 101*   Lipid Profile: No results for input(s): CHOL, HDL, LDLCALC, TRIG, CHOLHDL, LDLDIRECT in the last 72  hours. Thyroid Function Tests: Recent Labs    04/03/20 0701 04/05/20 0507  TSH 11.124*  --   FREET4  --  0.64   Anemia Panel: Recent Labs    04/03/20 0701  VITAMINB12 4,497*  FOLATE 14.7      Radiology Studies: I have reviewed all of the imaging during this hospital visit personally     Scheduled Meds: . Chlorhexidine Gluconate Cloth  6 each Topical Daily  . enoxaparin (LOVENOX) injection  60 mg Subcutaneous Q12H  . feeding supplement  237 mL Oral BID BM  . mometasone-formoterol  2 puff Inhalation BID  . multivitamin with minerals  1 tablet Oral Daily  . sodium chloride flush  10-40 mL Intracatheter Q12H   Continuous Infusions: . azithromycin 500 mg (04/05/20 0953)  . cefTRIAXone (ROCEPHIN)  IV 2 g (04/05/20 0810)     LOS: 2 days        Kailin Leu Gerome Apley, MD

## 2020-04-06 DIAGNOSIS — R531 Weakness: Secondary | ICD-10-CM | POA: Diagnosis not present

## 2020-04-06 DIAGNOSIS — J9601 Acute respiratory failure with hypoxia: Secondary | ICD-10-CM | POA: Diagnosis not present

## 2020-04-06 DIAGNOSIS — C3491 Malignant neoplasm of unspecified part of right bronchus or lung: Secondary | ICD-10-CM | POA: Diagnosis not present

## 2020-04-06 DIAGNOSIS — Z515 Encounter for palliative care: Secondary | ICD-10-CM | POA: Diagnosis not present

## 2020-04-06 DIAGNOSIS — E876 Hypokalemia: Secondary | ICD-10-CM

## 2020-04-06 DIAGNOSIS — D539 Nutritional anemia, unspecified: Secondary | ICD-10-CM | POA: Diagnosis not present

## 2020-04-06 DIAGNOSIS — Z7189 Other specified counseling: Secondary | ICD-10-CM | POA: Diagnosis not present

## 2020-04-06 LAB — CBC WITH DIFFERENTIAL/PLATELET
Abs Immature Granulocytes: 0.17 10*3/uL — ABNORMAL HIGH (ref 0.00–0.07)
Basophils Absolute: 0.1 10*3/uL (ref 0.0–0.1)
Basophils Relative: 1 %
Eosinophils Absolute: 0 10*3/uL (ref 0.0–0.5)
Eosinophils Relative: 0 %
HCT: 23.2 % — ABNORMAL LOW (ref 36.0–46.0)
Hemoglobin: 6.9 g/dL — CL (ref 12.0–15.0)
Immature Granulocytes: 1 %
Lymphocytes Relative: 15 %
Lymphs Abs: 2.2 10*3/uL (ref 0.7–4.0)
MCH: 33.3 pg (ref 26.0–34.0)
MCHC: 29.7 g/dL — ABNORMAL LOW (ref 30.0–36.0)
MCV: 112.1 fL — ABNORMAL HIGH (ref 80.0–100.0)
Monocytes Absolute: 1.2 10*3/uL — ABNORMAL HIGH (ref 0.1–1.0)
Monocytes Relative: 8 %
Neutro Abs: 10.9 10*3/uL — ABNORMAL HIGH (ref 1.7–7.7)
Neutrophils Relative %: 75 %
Platelets: 168 10*3/uL (ref 150–400)
RBC: 2.07 MIL/uL — ABNORMAL LOW (ref 3.87–5.11)
RDW: 23.7 % — ABNORMAL HIGH (ref 11.5–15.5)
WBC: 14.5 10*3/uL — ABNORMAL HIGH (ref 4.0–10.5)
nRBC: 0 % (ref 0.0–0.2)

## 2020-04-06 LAB — ABO/RH: ABO/RH(D): O POS

## 2020-04-06 LAB — BASIC METABOLIC PANEL
Anion gap: 5 (ref 5–15)
BUN: 12 mg/dL (ref 6–20)
CO2: 27 mmol/L (ref 22–32)
Calcium: 7.5 mg/dL — ABNORMAL LOW (ref 8.9–10.3)
Chloride: 103 mmol/L (ref 98–111)
Creatinine, Ser: 0.6 mg/dL (ref 0.44–1.00)
GFR, Estimated: >60 mL/min (ref >60)
Glucose, Bld: 87 mg/dL (ref 70–99)
Potassium: 3.4 mmol/L — ABNORMAL LOW (ref 3.5–5.1)
Sodium: 135 mmol/L (ref 135–145)

## 2020-04-06 LAB — URINE CULTURE: Culture: NO GROWTH

## 2020-04-06 LAB — T3: T3, Total: 52 ng/dL — ABNORMAL LOW (ref 71–180)

## 2020-04-06 LAB — PREPARE RBC (CROSSMATCH)

## 2020-04-06 MED ORDER — IPRATROPIUM-ALBUTEROL 0.5-2.5 (3) MG/3ML IN SOLN
3.0000 mL | Freq: Two times a day (BID) | RESPIRATORY_TRACT | Status: DC
  Administered 2020-04-06 – 2020-04-08 (×4): 3 mL via RESPIRATORY_TRACT
  Filled 2020-04-06 (×4): qty 3

## 2020-04-06 MED ORDER — DIPHENHYDRAMINE HCL 50 MG/ML IJ SOLN
25.0000 mg | Freq: Once | INTRAMUSCULAR | Status: AC
  Administered 2020-04-06: 25 mg via INTRAVENOUS
  Filled 2020-04-06: qty 1

## 2020-04-06 MED ORDER — SODIUM CHLORIDE 0.9% IV SOLUTION: Freq: Once | INTRAVENOUS | Status: DC

## 2020-04-06 MED ORDER — FUROSEMIDE 20 MG PO TABS
20.0000 mg | ORAL_TABLET | Freq: Every day | ORAL | Status: DC
  Administered 2020-04-06 – 2020-04-08 (×3): 20 mg via ORAL
  Filled 2020-04-06 (×3): qty 1

## 2020-04-06 MED ORDER — ACETAMINOPHEN 500 MG PO TABS
1000.0000 mg | ORAL_TABLET | Freq: Once | ORAL | Status: AC
  Administered 2020-04-06: 1000 mg via ORAL
  Filled 2020-04-06: qty 2

## 2020-04-06 MED ORDER — POTASSIUM CHLORIDE CRYS ER 20 MEQ PO TBCR
20.0000 meq | EXTENDED_RELEASE_TABLET | Freq: Once | ORAL | Status: AC
  Administered 2020-04-06: 20 meq via ORAL
  Filled 2020-04-06: qty 1

## 2020-04-06 NOTE — Progress Notes (Signed)
Pt has critical Hgb 6.9. Notified E. Ouma NP, provider on call, of this result. Will await possible new orders. Hortencia Conradi RN

## 2020-04-06 NOTE — Progress Notes (Addendum)
Chaplain engaged in an initial visit with April Huff and her sister.  Chaplain offered education around the Advanced Directive [employer] POA) document.  Chaplain explained process of getting in touch with her when [name] is ready to complete it.    Chaplain also checked in with April Huff who conveyed that she was doing as well as she could be.  Chaplain offered support.  Chaplain will follow-up.    04/06/20 1300  Clinical Encounter Type  Visited With Patient and family together  Visit Type Social support

## 2020-04-06 NOTE — Progress Notes (Signed)
PROGRESS NOTE    April Huff  YSA:[id number] DOB: 16-May-1960 DOA: 04/03/2020 PCP: Mindi Curling, PA-C   Brief Narrative:    Patient is a 60 y.o. female with past medical history significant for COPD, bipolar disorder, hypothyroidism, and right lung adenocarcinoma status post lobectomy, currently on chemotherapy.  Patient lives at home and has developed progressive worsening of generalized weakness, dyspnea with oxygen saturations down to 80 % and associated falls.  As outpatient, patient recently had a urinary tract infection. In ED, pt was afebrile with blood pressure 92/60, 109/60, heart rate 90, respiratory rate 18, temperature 98.1, oxygen saturation 93% on supplemental oxygen, with decreased breath sounds bilaterally, and bilateral pitting edema.   Pertinent labs: Sodium 135, potassium 2.9, chloride 98, bicarb 25, glucose 86, BUN 18, creatinine 0.82, white count 31.1, hemoglobin 8.6, hematocrit 27.8, platelets 266. SARS COVID-19 negative. Urinalysis specific gravity 1.019, negative for nitrates. Head CT negative for acute changes.  Chest radiograph with bilateral pleural effusions more left than right. EKG 90 bpm, normal axis, normal intervals, sinus rhythm, poor R progression, no ST segment or T wave changes.  CT chest showed positive pulmonary embolism, bilateral pleural effusions more left than right. Ultrasound lower extremities negative for deep vein thrombosis.  CT of the abdomen with acute to subacute thrombosis of the portal vein.  Multiple liver metastasis.  Palliative care service has been consulted. Patient's prognosis is poor due to being very weak and deconditioned with very low functional capacity and is unlikely a candidate for further chemotherapy.  Her sister is willing to take patient home with home services when patient stable.   Assessment & Plan:   Principal Problem:   Acute respiratory failure with hypoxia (HCC) Active Problems:   Adenocarcinoma of  right lung (HCC)   Debility   Macrocytic anemia   Hypokalemia   Leg swelling   COPD (chronic obstructive pulmonary disease) with emphysema (HCC)   Bipolar disorder (HCC)   Hypothyroid   Chest pain   Peripheral edema   Malnutrition of moderate degree   Acute hypoxemic respiratory failure with bilateral pleural effusions more Left than Right, and pulmonary embolism in the setting of lung cancer (stage IIB adenocarcinoma) s/p rt upper lobectomy November 2021, on adjuvant chemotherapy with carboplatin/pemetrexed  -CXR shows persistent bibasilar atelectasis and moderate left-sided pleural effusion, with possible pneumonia  -Currently on 4 L/min per [place], SpO2 93-94 % with goal to maintain  oxygen saturation 92% or greater.  -Continue bronchodilator therapy albuterol and ipratropium.  -WBC's 21.1->17.4->25.7 (04/05/20)->14.5, down trending but elevated, afebrile -Continue antibiotic therapy with Zosyn -Enoxaparinas anticoagulation for PE  -Continue outpatient oncology f/u with Dr. Georgiann Cocker in [place]  Recent falls, debility -Generalized weakness, fatigue, and debility -CT head negative for acute CVA, ammonium, Vit B12, and folate WNL, TSH elevated -Continue PT, supportive care    Hypothyroidism -TSH elevated at 11.124, currently no home medications -Consider adding synthroid  COPD, chronic stable -Continue bronchodilator therapy, supplemental oxygen as needed  Urinary retention.  -Foley catheter placed for urinary retention.  Hypokalemia -K 3.5->4.0->3.4, replete as needed -Renal function stable with Crt 0.60  Anemia of chronic disease, chronic stable -Hgb at baseline of 7.1-7.9  Calorie malnutrition, moderate.  -Continue with nutritional supplements.   Depression/Bipolar disorder/Dyskinesia Continue home medications of bupropion, divaloprex, olanzapine, sertraline and trazodone, and velbenazine.    DVT prophylaxis: Lovenox Code Status: DNR Family Communication:  Sister at bedside.  Status is: Inpatient   Dispo: The patient is from: Home  Anticipated d/c is to: Home with hospice              Patient currently is not medically stable for discharge.   Difficult to place patient: No   Body mass index is 22.46 kg/m.  Consultants:   Palliative care  Procedures:   None  Antimicrobials:   vanc and zosyn    Subjective:  Patient is very weak and deconditioned, major complaints are dyspnea with minimal exertion.  Examination:  General exam: Chronically ill and deconditioned. HEENT: Pallor, non-icteric, oral mucosa moist Respiratory system: Clear to auscultation. Respiratory effort decreased. Cardiovascular system: S1 & S2 heard, RRR. No JVD, murmurs, rubs, gallops or clicks. Trace pedal edema. Gastrointestinal system: Abdomen is mildly distended, soft and nontender. No organomegaly or masses felt. Normal bowel sounds heard. Central nervous system: Somnolent but easy to arouse. No focal neurological deficits. Extremities: Symmetric 5 x 5 power. Skin: No rashes, lesions or ulcers Psychiatry: Judgement and insight appear normal. Mood & affect appropriate.     Objective: Vitals:   04/05/20 2053 04/05/20 2219 04/06/20 0521 04/06/20 0720  BP:  (!) 81/60 92/62   Pulse:  92 97   Resp:  14    Temp:  97.8 F (36.6 C) 98.4 F (36.9 C)   TempSrc:  Oral Oral   SpO2: 99% 100% 93% 90%  Weight:      Height:        Intake/Output Summary (Last 24 hours) at 04/06/2020 1239 Last data filed at 04/06/2020 0930 Gross per 24 hour  Intake 1124.93 ml  Output --  Net 1124.93 ml   Filed Weights   04/03/20 0243 04/03/20 0556 04/05/20 0500  Weight: 61.2 kg 56.5 kg 55.7 kg     Data Reviewed:   CBC: Recent Labs  Lab 04/03/20 0157 04/04/20 0732 04/05/20 0507 04/06/20 0500  WBC 21.1* 17.4* 25.7* 14.5*  NEUTROABS 15.8* 12.8* 20.8* 10.9*  HGB 8.6* 7.1* 7.9* 6.9*  HCT 27.8* 23.7* 26.7* 23.2*  MCV 108.6* 110.2* 111.3* 112.1*   PLT 266 187 279 300   Basic Metabolic Panel: Recent Labs  Lab 04/03/20 0157 04/03/20 0701 04/04/20 0732 04/05/20 0507 04/06/20 0500  NA 135  --  134* 134* 135  K 2.9*  --  3.5 4.0 3.4*  CL 98  --  100 100 103  CO2 25  --  24 25 27   GLUCOSE 86  --  69* 106* 87  BUN 18  --  14 13 12   CREATININE 0.82  --  0.62 0.60 0.60  CALCIUM 7.7*  --  7.3* 7.7* 7.5*  MG  --  2.1 2.0 1.9  --   PHOS  --   --  2.8 2.7  --    GFR: Estimated Creatinine Clearance: 60.6 mL/min (by C-G formula based on SCr of 0.6 mg/dL). Liver Function Tests: Recent Labs  Lab 04/03/20 0157 04/04/20 0732 04/05/20 0507  AST 25 20 28   ALT 17 14 16   ALKPHOS 121 93 100  BILITOT 0.5 0.7 0.9  PROT 5.8* 4.6* 5.0*  ALBUMIN 1.9* 1.4* 1.7*   No results for input(s): LIPASE, AMYLASE in the last 168 hours. Recent Labs  Lab 04/03/20 0753  AMMONIA 23   Coagulation Profile: Recent Labs  Lab 04/03/20 0157 04/03/20 1503  INR 2.3* 2.0*   Cardiac Enzymes: No results for input(s): CKTOTAL, CKMB, CKMBINDEX, TROPONINI in the last 168 hours. BNP (last 3 results) No results for input(s): PROBNP in the last 8760 hours. HbA1C: No results for  input(s): HGBA1C in the last 72 hours. CBG: Recent Labs  Lab 04/03/20 0240  GLUCAP 101*   Lipid Profile: No results for input(s): CHOL, HDL, LDLCALC, TRIG, CHOLHDL, LDLDIRECT in the last 72 hours. Thyroid Function Tests: Recent Labs    04/05/20 0507  FREET4 0.64   Anemia Panel: No results for input(s): VITAMINB12, FOLATE, FERRITIN, TIBC, IRON, RETICCTPCT in the last 72 hours. Sepsis Labs: Recent Labs  Lab 04/03/20 0157 04/03/20 0701 04/04/20 0732 04/05/20 0507  PROCALCITON  --  0.41 0.24 0.47  LATICACIDVEN 1.5  --   --   --     Recent Results (from the past 240 hour(s))  Blood culture (routine single)     Status: None (Preliminary result)   Collection Time: 04/03/20  1:57 AM   Specimen: BLOOD  Result Value Ref Range Status   Specimen Description   Final     BLOOD BLOOD LEFT FOREARM Performed at [facility] [place] [address], [place], [place] [address]    Special Requests   Final    BOTTLES DRAWN AEROBIC AND ANAEROBIC Blood Culture results may not be optimal due to an inadequate volume of blood received in culture bottles Performed at [place] [address] [place], [place] [address]    Culture   Final    NO GROWTH 2 DAYS Performed at [place] [address], [place], [place] [address]    Report Status PENDING  Incomplete  Urine culture     Status: None   Collection Time: 04/03/20  1:57 AM   Specimen: In/Out Cath Urine  Result Value Ref Range Status   Specimen Description   Final    IN/OUT CATH URINE Performed at [place] [address], [place], [place] [address]    Special Requests   Final    NONE Performed at [facility], [place] [address] [place], [place] [address]    Culture   Final    NO GROWTH Performed at [facility], [place] [address], [place], [place] [address]    Report Status 04/06/2020 FINAL  Final  MRSA PCR Screening     Status: None   Collection Time: 04/05/20  6:02 PM   Specimen: Nasal Mucosa; Nasopharyngeal  Result Value Ref Range Status   MRSA by PCR NEGATIVE NEGATIVE Final    Comment:        The GeneXpert MRSA Assay (FDA approved for NASAL specimens only), is one component of a comprehensive MRSA colonization surveillance program. It is not intended to diagnose MRSA infection nor to guide or monitor treatment for MRSA infections. Performed at [facility], [place] [address], [place],  [address]          Radiology Studies: DG CHEST PORT 1 VIEW  Result Date: 04/05/2020 CLINICAL DATA:  Shortness of breath. EXAM: PORTABLE CHEST 1 VIEW COMPARISON:  04/04/2020.  CT 04/04/2020. FINDINGS: Port-A-Cath noted with tip over SVC. Heart size stable. Persistent bibasilar atelectasis and  moderate left-sided pleural effusion. No pneumothorax. IMPRESSION: Persistent bibasilar atelectasis and moderate left-sided pleural effusion. Chest appears similar to prior exams. Electronically Signed   By: Marcello Moores  Register   On: 04/05/2020 05:41        Scheduled Meds: . buPROPion  150 mg Oral q morning  . Chlorhexidine Gluconate Cloth  6 each Topical Daily  . divalproex  1,000 mg Oral QHS  . enoxaparin (LOVENOX) injection  60 mg Subcutaneous Q12H  . feeding  supplement  237 mL Oral BID BM  . ipratropium-albuterol  3 mL Nebulization BID  . mometasone-formoterol  2 puff Inhalation BID  . multivitamin with minerals  1 tablet Oral Daily  . OLANZapine  20 mg Oral QHS  . sertraline  150 mg Oral Daily  . sodium chloride flush  10-40 mL Intracatheter Q12H  . traZODone  100 mg Oral QHS  . Valbenazine Tosylate  80 mg Oral QHS   Continuous Infusions: . piperacillin-tazobactam (ZOSYN)  IV 12.5 mL/hr at 04/06/20 0637     LOS: 3 days   Time spent= 35 mins    Dede Query, RN NP-Student   If 7PM-7AM, please contact night-coverage  04/06/2020, 12:39 PM

## 2020-04-06 NOTE — Progress Notes (Signed)
Daily Progress Note   Patient Name: April Huff       Date: 04/06/2020 DOB: 1960-11-17  Age: 60 y.o. MRN#: [id number] Attending Physician: Oswald Hillock, MD Primary Care Physician: Juanda Chance Admit Date: 04/03/2020  Reason for Consultation/Follow-up: Establishing goals of care  Subjective: Saw Ms. April Huff this morning. She was accompanied by her sister, April Huff, at bedside. She was able to eat a good breakfast this morning. She denies pain or shortness of breath at rest.   We took time to go into more detail about next steps once she is ready to leave the hospital. Her primary two options would be a trial of rehab to see if she would be able to regain strength versus electing to go home with hospice to focus on comfort and quality of life with her remaining time. She and her sister are going to think things over as she continues inpatient treatment for her infection and blood clot.    Length of Stay: 3 days  Current Medications: Scheduled Meds:  . buPROPion  150 mg Oral q morning  . Chlorhexidine Gluconate Cloth  6 each Topical Daily  . divalproex  1,000 mg Oral QHS  . enoxaparin (LOVENOX) injection  60 mg Subcutaneous Q12H  . feeding supplement  237 mL Oral BID BM  . ipratropium-albuterol  3 mL Nebulization BID  . mometasone-formoterol  2 puff Inhalation BID  . multivitamin with minerals  1 tablet Oral Daily  . OLANZapine  20 mg Oral QHS  . sertraline  150 mg Oral Daily  . sodium chloride flush  10-40 mL Intracatheter Q12H  . traZODone  100 mg Oral QHS  . Valbenazine Tosylate  80 mg Oral QHS    Continuous Infusions: . piperacillin-tazobactam (ZOSYN)  IV 12.5 mL/hr at 04/06/20 0637    PRN Meds: acetaminophen **OR** acetaminophen, albuterol, ondansetron **OR** ondansetron (ZOFRAN) IV, senna-docusate, sodium chloride flush  Palliative Performance Scale: 40%     Vital Signs: BP 92/62 (BP Location: Left Arm)   Pulse 97   Temp 98.4 F (36.9 C) (Oral)   Resp 14    Ht 5\' 2"  (1.575 m)   Wt 55.7 kg   SpO2 90%   BMI 22.46 kg/m  SpO2: SpO2: 90 % O2 Device: O2 Device: Nasal Cannula O2 Flow Rate: O2 Flow Rate (L/min): 4 L/min  Intake/output summary:   Intake/Output Summary (Last 24 hours) at 04/06/2020 1255 Last data filed at 04/06/2020 0930 Gross per 24 hour  Intake 1124.93 ml  Output -  Net 1124.93 ml   Baseline Weight: Weight: 61.2 kg Most recent weight: Weight: 55.7 kg  Physical Exam: General: ill-appearing, sitting up in bed in NAD Pulm: breathing comfortably on 3L [place] Neuro: more awake and interactive today, non-focal              Additional Data Reviewed: Recent Labs    04/05/20 0507 04/06/20 0500  WBC 25.7* 14.5*  HGB 7.9* 6.9*  PLT 279 168  NA 134* 135  BUN 13 12  CREATININE 0.60 0.60     Problem List:  Patient Active Problem List   Diagnosis Date Noted  . Acute respiratory failure with hypoxia ([place]) 04/03/2020  . Adenocarcinoma of right lung ([place]) 04/03/2020  . Debility 04/03/2020  . Macrocytic anemia 04/03/2020  . Hypokalemia 04/03/2020  . Leg swelling 04/03/2020  . COPD (chronic obstructive pulmonary disease) with emphysema ([place]) 04/03/2020  . Bipolar disorder ([place]) 04/03/2020  . Hypothyroid 04/03/2020  . Chest pain 04/03/2020  .  Malnutrition of moderate degree 04/03/2020  . Peripheral edema      Palliative Care Assessment & Plan    Code Status:  DNR  Goals of Care:  Treat any reversible conditions  Optimize quality of life    Symptom Management:  She does not require any additional symptom management at this time   Psycho-social/Spiritual:  Desire for further Chaplaincy support:yes, appreciate chaplain assistance in HCPOA paperwork    Prognosis: < 6 months Discharge Planning: To Be Determined based on progress in hospital. She and her sister are deciding between trial of rehab versus home with hospice services.    Care plan was discussed with sister April Huff and also with the patient.    Thank you for allowing the Palliative Medicine Team to assist in the care of this patient.   Time In: 1300 Time Out: 1335 Total Time 35 Prolonged Time Billed No     Greater than 50%  of this time was spent counseling and coordinating care related to the above assessment and plan.   Delice Bison, DO  04/06/2020, 12:55 PM  Please contact Palliative Medicine Team phone at [phone] for questions and concerns.   PMT Attending Addendum: Patient seen and examined, discussed with patient and sister who is at the bedside. We continue to discuss broad goals of care, next steps and disposition options. We explored in some depth today about hospice philosophy of care as opposed to rehab efforts. Continue scope of current hospitalization. PMT to follow. Rest as above.  Loistine Chance MD [place] [facility].

## 2020-04-06 NOTE — Progress Notes (Signed)
[facility]  PROGRESS NOTE  April Huff MEQ:[id number] DOB: 1960-09-02 DOA: 04/03/2020 PCP: Mindi Curling, PA-C   Brief HPI:   60 year old female, with medical history of COPD, bipolar disorder, hypothyroidism, adenocarcinoma of right lung status post right upper lobectomy now on chemotherapy came to ED with complaints of generalized weakness, shortness of breath, falls.  Patient has been becoming increasingly fatigued resulting in multiple falls.  She was reportedly had O2 sats of 80% at home.  In the ED chest x-ray showed left base atelectasis infiltrate.  Patient was started on antibiotic therapy and further work-up with CT chest showed pulmonary embolism, bilateral pleural effusion.  Venous duplex of lower extremities were negative for DVT.  CT of the abdomen showed acute to subacute thrombosis of the portal vein, multiple liver metastasis. Palliative care was consulted.   Subjective   Patient seen and examined, denies any pain.  Denies shortness of breath.   Assessment/Plan:     1. Acute hypoxemic respiratory failure-secondary to pulmonary embolism in setting of lung cancer, possible pneumonia.  Currently requiring 4 L/min of oxygen via nasal cannula.  Patient is currently on vancomycin and Zosyn.  Will discontinue vancomycin as MRSA screen is negative. 2. Pulmonary embolism-CTA chest showed small single pulmonary embolus in the lingular pulmonary artery branch.  Patient started on anticoagulation with Lovenox. 3. COPD-no exacerbation.  Continue as needed bronchodilator therapy. 4. 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  Follows Dr. Georgiann Cocker in [place].  She is getting adjuvant chemotherapy with carboplatinum/pemetrexed. 5. 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  Patient on anticoagulation as above. 6. Anemia-likely in setting of malignancy.  Today  hemoglobin is 6.9.  Will transfuse 1 unit PRBC. 7. Hypokalemia-potassium is 2.4, replace potassium and follow BMP in am. 8. Goals of care-palliative care was consulted, patient and sister are discussing with consultants regarding going to home with hospice versus going to rehab.     COVID-19 Labs  No results for input(s): DDIMER, FERRITIN, LDH, CRP in the last 72 hours.  No results found for: SARSCOV2NAA   Scheduled medications:   . buPROPion  150 mg Oral q morning  . Chlorhexidine Gluconate Cloth  6 each Topical Daily  . divalproex  1,000 mg Oral QHS  . enoxaparin (LOVENOX) injection  60 mg Subcutaneous Q12H  . feeding supplement  237 mL Oral BID BM  . ipratropium-albuterol  3 mL Nebulization BID  . mometasone-formoterol  2 puff Inhalation BID  . multivitamin with minerals  1 tablet Oral Daily  . OLANZapine  20 mg Oral QHS  . sertraline  150 mg Oral Daily  . sodium chloride flush  10-40 mL Intracatheter Q12H  . traZODone  100 mg Oral QHS  . Valbenazine Tosylate  80 mg Oral QHS         CBG: Recent Labs  Lab 04/03/20 0240  GLUCAP 101*    SpO2: 100 % O2 Flow Rate (L/min): 4 L/min    CBC: Recent Labs  Lab 04/03/20 0157 04/04/20 0732 04/05/20 0507 04/06/20 0500  WBC 21.1* 17.4* 25.7* 14.5*  NEUTROABS 15.8* 12.8* 20.8* 10.9*  HGB 8.6* 7.1* 7.9* 6.9*  HCT 27.8* 23.7* 26.7* 23.2*  MCV 108.6* 110.2* 111.3* 112.1*  PLT 266 187 279 297    Basic Metabolic Panel: Recent Labs  Lab 04/03/20 0157 04/03/20 0701 04/04/20 0732 04/05/20 0507 04/06/20 0500  NA 135  --  134* 134*  135  K 2.9*  --  3.5 4.0 3.4*  CL 98  --  100 100 103  CO2 25  --  24 25 27   GLUCOSE 86  --  69* 106* 87  BUN 18  --  14 13 12   CREATININE 0.82  --  0.62 0.60 0.60  CALCIUM 7.7*  --  7.3* 7.7* 7.5*  MG  --  2.1 2.0 1.9  --   PHOS  --   --  2.8 2.7  --      Liver Function Tests: Recent Labs  Lab 04/03/20 0157 04/04/20 0732 04/05/20 0507  AST 25 20 28   ALT 17 14 16   ALKPHOS  121 93 100  BILITOT 0.5 0.7 0.9  PROT 5.8* 4.6* 5.0*  ALBUMIN 1.9* 1.4* 1.7*     Antibiotics: Anti-infectives (From admission, onward)   Start     Dose/Rate Route Frequency Ordered Stop   04/06/20 1800  vancomycin (VANCOREADY) IVPB 1000 mg/200 mL  Status:  Discontinued        1,000 mg 200 mL/hr over 60 Minutes Intravenous Every 24 hours 04/05/20 1639 04/06/20 1047   04/05/20 1800  piperacillin-tazobactam (ZOSYN) IVPB 3.375 g        3.375 g 12.5 mL/hr over 240 Minutes Intravenous Every 8 hours 04/05/20 1627     04/05/20 1730  vancomycin (VANCOREADY) IVPB 1250 mg/250 mL        1,250 mg 166.7 mL/hr over 90 Minutes Intravenous  Once 04/05/20 1633 04/05/20 1904   04/03/20 0800  cefTRIAXone (ROCEPHIN) 2 g in sodium chloride 0.9 % 100 mL IVPB  Status:  Discontinued        2 g 200 mL/hr over 30 Minutes Intravenous Every 24 hours 04/03/20 0624 04/05/20 1617   04/03/20 0800  azithromycin (ZITHROMAX) 500 mg in sodium chloride 0.9 % 250 mL IVPB  Status:  Discontinued        500 mg 250 mL/hr over 60 Minutes Intravenous Every 24 hours 04/03/20 0624 04/05/20 1617       DVT prophylaxis: Lovenox  Code Status: DNR  Family Communication: Discussed with patient's sister at bedside   Consultants:  Palliative care  Procedures:      Objective   Vitals:   04/05/20 2219 04/06/20 0521 04/06/20 0720 04/06/20 1354  BP: (!) 81/60 92/62  96/61  Pulse: 92 97  93  Resp: 14   14  Temp: 97.8 F (36.6 C) 98.4 F (36.9 C)  97.7 F (36.5 C)  TempSrc: Oral Oral  Oral  SpO2: 100% 93% 90% 100%  Weight:      Height:        Intake/Output Summary (Last 24 hours) at 04/06/2020 1520 Last data filed at 04/06/2020 1355 Gross per 24 hour  Intake 1244.93 ml  Output -  Net 1244.93 ml    03/01 1901 - 03/03 0700 In: 1254.9 [P.O.:480; I.V.:10] Out: 300 [Urine:300]  Filed Weights   04/03/20 0243 04/03/20 0556 04/05/20 0500  Weight: 61.2 kg 56.5 kg 55.7 kg    Physical  Examination:    General-appears in no acute distress  Heart-S1-S2, regular, no murmur auscultated  Lungs-clear to auscultation bilaterally, no wheezing or crackles auscultated  Abdomen-soft, nontender, no organomegaly  Extremities-no edema in the lower extremities  Neuro-alert, oriented x3, no focal deficit noted   Status is: Inpatient  Dispo: The patient is from: Home              Anticipated d/c is to: Home with hospice versus  skilled facility              Anticipated d/c date is: 04/07/2020              Patient currently not stable for discharge  Barrier to discharge-ongoing discussion about disposition with palliative care       Data Reviewed:   Recent Results (from the past 240 hour(s))  Blood culture (routine single)     Status: None (Preliminary result)   Collection Time: 04/03/20  1:57 AM   Specimen: BLOOD  Result Value Ref Range Status   Specimen Description   Final    BLOOD BLOOD LEFT FOREARM Performed at [facility], [place] [address], [place], [place] [address]    Special Requests   Final    BOTTLES DRAWN AEROBIC AND ANAEROBIC Blood Culture results may not be optimal due to an inadequate volume of blood received in culture bottles Performed at [place] [address], [place], [place] [address]    Culture   Final    NO GROWTH 3 DAYS Performed at [facility], [place] [address], [place], [place] [address]    Report Status PENDING  Incomplete  Urine culture     Status: None   Collection Time: 04/03/20  1:57 AM   Specimen: In/Out Cath Urine  Result Value Ref Range Status   Specimen Description   Final    IN/OUT CATH URINE Performed at [facility] [address] [place], [place] [address]    Special Requests   Final    NONE Performed at [facility] [place] [address] [place], [place] [address]    Culture   Final    NO GROWTH Performed at [facility],  [place] [address] [place], [place] [address]    Report Status 04/06/2020 FINAL  Final  MRSA PCR Screening     Status: None   Collection Time: 04/05/20  6:02 PM   Specimen: Nasal Mucosa; Nasopharyngeal  Result Value Ref Range Status   MRSA by PCR NEGATIVE NEGATIVE Final    Comment:        The GeneXpert MRSA Assay (FDA approved for NASAL specimens only), is one component of a comprehensive MRSA colonization surveillance program. It is not intended to diagnose MRSA infection nor to guide or monitor treatment for MRSA infections. Performed at [facility], [place] [address], [place], [place] [address]     No results for input(s): LIPASE, AMYLASE in the last 168 hours. Recent Labs  Lab 04/03/20 0753  AMMONIA 23    Cardiac Enzymes: No results for input(s): CKTOTAL, CKMB, CKMBINDEX, TROPONINI in the last 168 hours. BNP (last 3 results) Recent Labs    04/03/20 0224  BNP 80.3    ProBNP (last 3 results) No results for input(s): PROBNP in the last 8760 hours.  Studies:  DG CHEST PORT 1 VIEW  Result Date: 04/05/2020 CLINICAL DATA:  Shortness of breath. EXAM: PORTABLE CHEST 1 VIEW COMPARISON:  04/04/2020.  CT 04/04/2020. FINDINGS: Port-A-Cath noted with tip over SVC. Heart size stable. Persistent bibasilar atelectasis and moderate left-sided pleural effusion. No pneumothorax. IMPRESSION: Persistent bibasilar atelectasis and moderate left-sided pleural effusion. Chest appears similar to prior exams. Electronically Signed   By: Marcello Moores  Register   On: 04/05/2020 05:41       [place]   [facility] If 7PM-7AM, please contact night-coverage at [email or web address], Office  [phone]   04/06/2020, 3:20 PM  LOS:  3 days

## 2020-04-06 NOTE — TOC Progression Note (Signed)
Transition of Care [facility]) - Progression Note    Patient Details  Name: April Huff MRN: [id number] Date of Birth: Oct 28, 1960  [facility]) CM/SW Contact  Shade Flood, LCSW Phone Number: 04/06/2020, 3:09 PM  Clinical Narrative:     TOC following. Continuing to work on [employer] for pt. Palliative care notes indicate pt and her sister are discussing whether pt will try a rehab stay at dc vs going home with hospice. TOC will follow and continue to assist with dc planning.  Expected Discharge Plan: [place] Barriers to Discharge: Continued Medical Work up  Expected Discharge Plan and Services Expected Discharge Plan: [place]   Discharge Planning Services: CM Consult   Living arrangements for the past 2 months: Single Family Home                                       Social Determinants of Health (SDOH) Interventions    Readmission Risk Interventions Readmission Risk Prevention Plan 04/04/2020  Transportation Screening Complete  PCP or Specialist Appt within 3-5 Days Complete  HRI or [place] Complete  Social Work Consult for [place] Planning/Counseling Complete  Palliative Care Screening Not Applicable  Medication Review Press photographer) Complete

## 2020-04-07 DIAGNOSIS — C3491 Malignant neoplasm of unspecified part of right bronchus or lung: Secondary | ICD-10-CM | POA: Diagnosis not present

## 2020-04-07 DIAGNOSIS — J9601 Acute respiratory failure with hypoxia: Secondary | ICD-10-CM | POA: Diagnosis not present

## 2020-04-07 DIAGNOSIS — D539 Nutritional anemia, unspecified: Secondary | ICD-10-CM | POA: Diagnosis not present

## 2020-04-07 DIAGNOSIS — E876 Hypokalemia: Secondary | ICD-10-CM | POA: Diagnosis not present

## 2020-04-07 DIAGNOSIS — Z7189 Other specified counseling: Secondary | ICD-10-CM | POA: Diagnosis not present

## 2020-04-07 DIAGNOSIS — Z515 Encounter for palliative care: Secondary | ICD-10-CM | POA: Diagnosis not present

## 2020-04-07 DIAGNOSIS — R531 Weakness: Secondary | ICD-10-CM | POA: Diagnosis not present

## 2020-04-07 LAB — CBC
HCT: 28 % — ABNORMAL LOW (ref 36.0–46.0)
Hemoglobin: 8.4 g/dL — ABNORMAL LOW (ref 12.0–15.0)
MCH: 30.9 pg (ref 26.0–34.0)
MCHC: 30 g/dL (ref 30.0–36.0)
MCV: 102.9 fL — ABNORMAL HIGH (ref 80.0–100.0)
Platelets: 147 10*3/uL — ABNORMAL LOW (ref 150–400)
RBC: 2.72 MIL/uL — ABNORMAL LOW (ref 3.87–5.11)
RDW: 28.9 % — ABNORMAL HIGH (ref 11.5–15.5)
WBC: 12.7 10*3/uL — ABNORMAL HIGH (ref 4.0–10.5)
nRBC: 0.2 % (ref 0.0–0.2)

## 2020-04-07 LAB — BASIC METABOLIC PANEL
Anion gap: 7 (ref 5–15)
BUN: 10 mg/dL (ref 6–20)
CO2: 26 mmol/L (ref 22–32)
Calcium: 7.5 mg/dL — ABNORMAL LOW (ref 8.9–10.3)
Chloride: 104 mmol/L (ref 98–111)
Creatinine, Ser: 0.56 mg/dL (ref 0.44–1.00)
GFR, Estimated: >60 mL/min (ref >60)
Glucose, Bld: 85 mg/dL (ref 70–99)
Potassium: 3.7 mmol/L (ref 3.5–5.1)
Sodium: 137 mmol/L (ref 135–145)

## 2020-04-07 NOTE — Progress Notes (Signed)
Daily Progress Note   Patient Name: April Huff       Date: 04/07/2020 DOB: 1960/08/11  Age: 60 y.o. MRN#: [id number] Attending Physician: Oswald Hillock, MD Primary Care Physician: Juanda Chance Admit Date: 04/03/2020  Reason for Consultation/Follow-up: Establishing goals of care  Subjective: Saw April Huff this morning. She was accompanied by her sister, [name], at bedside. She continues to appear with generalized weakness.   We discussed again about home with hospice, patient and sister would like to proceed, see below.    Length of Stay: 4 days  Current Medications: Scheduled Meds:  . sodium chloride   Intravenous Once  . buPROPion  150 mg Oral q morning  . Chlorhexidine Gluconate Cloth  6 each Topical Daily  . divalproex  1,000 mg Oral QHS  . enoxaparin (LOVENOX) injection  60 mg Subcutaneous Q12H  . feeding supplement  237 mL Oral BID BM  . furosemide  20 mg Oral Daily  . ipratropium-albuterol  3 mL Nebulization BID  . mometasone-formoterol  2 puff Inhalation BID  . multivitamin with minerals  1 tablet Oral Daily  . OLANZapine  20 mg Oral QHS  . sertraline  150 mg Oral Daily  . sodium chloride flush  10-40 mL Intracatheter Q12H  . traZODone  100 mg Oral QHS  . Valbenazine Tosylate  80 mg Oral QHS    Continuous Infusions:   PRN Meds: acetaminophen **OR** acetaminophen, albuterol, ondansetron **OR** ondansetron (ZOFRAN) IV, senna-docusate, sodium chloride flush  Palliative Performance Scale: 40%     Vital Signs: BP 92/61 (BP Location: Left Arm)   Pulse 83   Temp (!) 97.4 F (36.3 C) (Oral)   Resp 14   Ht 5\' 2"  (1.575 m)   Wt 58.5 kg   SpO2 98%   BMI 23.59 kg/m  SpO2: SpO2: 98 % O2 Device: O2 Device: Nasal Cannula O2 Flow Rate: O2 Flow Rate (L/min): 4 L/min  Intake/output summary:   Intake/Output Summary (Last 24 hours) at 04/07/2020 1155 Last data filed at 04/07/2020 0900 Gross per 24 hour  Intake 1028 ml  Output 450 ml  Net 578 ml    Baseline Weight: Weight: 61.2 kg Most recent weight: Weight: 58.5 kg  Physical Exam: General: ill-appearing, sitting up in bed in NAD Pulm: breathing comfortably on 3L [place] Neuro: more awake and interactive today, non-focal              Additional Data Reviewed: Recent Labs    04/06/20 0500 04/07/20 0637  WBC 14.5* 12.7*  HGB 6.9* 8.4*  PLT 168 147*  NA 135 137  BUN 12 10  CREATININE 0.60 0.56     Problem List:  Patient Active Problem List   Diagnosis Date Noted  . Acute respiratory failure with hypoxia ([place]) 04/03/2020  . Adenocarcinoma of right lung ([place]) 04/03/2020  . Debility 04/03/2020  . Macrocytic anemia 04/03/2020  . Hypokalemia 04/03/2020  . Leg swelling 04/03/2020  . COPD (chronic obstructive pulmonary disease) with emphysema ([place]) 04/03/2020  . Bipolar disorder ([place]) 04/03/2020  . Hypothyroid 04/03/2020  . Chest pain 04/03/2020  . Malnutrition of moderate degree 04/03/2020  . Peripheral edema      Palliative Care Assessment & Plan    Code Status: DNR  Goals of Care: Treat any reversible conditions Optimize quality of life    Symptom Management: She does not require any additional symptom management at this time   Psycho-social/Spiritual: Desire for further Chaplaincy support:yes, appreciate chaplain assistance in [facility]  paperwork    Prognosis: < 6 months : likely to be less than 3 months in my opinion.  Discharge Planning:    home with hospice services.    Care plan was discussed with sister [name] and also with the patient.   Thank you for allowing the Palliative Medicine Team to assist in the care of this patient.   Time In: 9 Time Out: 9.35 Total Time 35 Prolonged Time Billed No     Greater than 50%  of this time was spent counseling and coordinating care related to the above assessment and plan.   Loistine Chance, MD  04/07/2020, 11:55 AM  Please contact Palliative Medicine Team phone at [phone] for questions and concerns.

## 2020-04-07 NOTE — TOC Progression Note (Signed)
Transition of Care [facility]) - Progression Note    Patient Details  Name: April Huff MRN: [id number] Date of Birth: [birth year]  [facility] CM/SW Contact  Shade Flood, LCSW Phone Number: 04/07/2020, 11:29 AM  Clinical Narrative:     TOC following. Per MD, pt and her sister have elected for dc home with hospice. This LCSW spoke with pt's sister this AM to update on hospice provider options. Referred to [facility] at sister's request. [facility] rep states that they are in the process of arranging DME which will be delivered tomorrow AM at sister's request as she needs to get the home ready for the delivery.   Anticipating pt will dc tomorrow with hospice care at home. She will likely need PTAR for transport.  Weekend TOC will follow.  Expected Discharge Plan: Home w Hospice Care Barriers to Discharge: Continued Medical Work up  Expected Discharge Plan and Services Expected Discharge Plan: [place]   Discharge Planning Services: CM Consult Post Acute Care Choice: Hospice Living arrangements for the past 2 months: Single Family Home                                       Social Determinants of Health (SDOH) Interventions    Readmission Risk Interventions Readmission Risk Prevention Plan 04/04/2020  Transportation Screening Complete  PCP or Specialist Appt within 3-5 Days Complete  HRI or [place] Complete  Social Work Consult for [place] Planning/Counseling Complete  Palliative Care Screening Not Applicable  Medication Review Press photographer) Complete

## 2020-04-07 NOTE — Progress Notes (Signed)
Patient's IV infiltrated LFA peripheral IV. Removed IV from site and applied gauze and tape with a warm compress. Patient tolerated well, does not c/o pain.

## 2020-04-07 NOTE — Progress Notes (Signed)
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  Patient has been becoming increasingly fatigued resulting in multiple falls.  She was reportedly had O2 sats of 80% at home.  In the ED chest x-ray showed left base atelectasis infiltrate.  Patient was started on antibiotic therapy and further work-up with CT chest showed pulmonary embolism, bilateral pleural effusion.  Venous duplex of lower extremities were negative for DVT.  CT of the abdomen showed acute to subacute thrombosis of the portal vein, multiple liver metastasis. Palliative care was consulted.   Subjective   Patient seen and examined, having diarrhea this morning.  Antibiotics stopped.  Discussed with sister at bedside.  She wants to take her home with hospice.   Assessment/Plan:     1. Acute hypoxemic respiratory failure-secondary to pulmonary embolism in setting of lung cancer, possible pneumonia.  Currently requiring 4 L/min of oxygen via nasal cannula.  Patient is currently on vancomycin and Zosyn.  Vancomycin was discontinued.  Zosyn also has been discontinued due to diarrhea. 2. Pulmonary embolism-CTA chest showed small single pulmonary embolus in the lingular pulmonary artery branch.  Patient was started on anticoagulation with Lovenox, however patient is now going home with hospice.  Discussed with palliative care Dr. Rowe Pavy, we both agree that continuing Lovenox at home will not be comfort.  So we will discontinue Lovenox at this time.   3. COPD-no exacerbation.  Continue as needed bronchodilator therapy. 4. Lung cancer-patient has stage IIb adenocarcinoma right lung status post right upper  lobectomy in November 2021.  Follows Dr. Georgiann Cocker in [place].  Patient is now comfort measures only. 5. 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  Anticoagulation was stopped after discussion with palliative care 6. Anemia-likely in setting of malignancy.  Hemoglobin was 6.9 yesterday, s/p 1 unit PRBC transfusion.  Today hemoglobin is 8.4. 7. Hypokalemia-replete 8. Goals of care-palliative care was consulted, patient and sister both agree to go home with hospice.     COVID-19 Labs  No results for input(s): DDIMER, FERRITIN, LDH, CRP in the last 72 hours.  No results found for: SARSCOV2NAA   Scheduled medications:   . sodium chloride   Intravenous Once  . buPROPion  150 mg Oral q morning  . Chlorhexidine Gluconate Cloth  6 each Topical Daily  . divalproex  1,000 mg Oral QHS  . enoxaparin (LOVENOX) injection  60 mg Subcutaneous Q12H  . feeding supplement  237 mL Oral BID BM  . furosemide  20 mg Oral Daily  . ipratropium-albuterol  3 mL Nebulization BID  . mometasone-formoterol  2 puff Inhalation BID  . multivitamin with minerals  1 tablet Oral Daily  . OLANZapine  20 mg Oral QHS  . sertraline  150 mg Oral Daily  . sodium chloride flush  10-40 mL Intracatheter Q12H  . traZODone  100 mg Oral QHS  . Valbenazine Tosylate  80 mg Oral QHS         CBG: Recent Labs  Lab 04/03/20 0240  GLUCAP 101*    SpO2: 99 % O2 Flow Rate (L/min): 4 L/min    CBC: Recent Labs  Lab 04/03/20 0157 04/04/20 0732 04/05/20 0507 04/06/20 0500 04/07/20  0637  WBC 21.1* 17.4* 25.7* 14.5* 12.7*  NEUTROABS 15.8* 12.8* 20.8* 10.9*  --   HGB 8.6* 7.1* 7.9* 6.9* 8.4*  HCT 27.8* 23.7* 26.7* 23.2* 28.0*  MCV 108.6* 110.2* 111.3* 112.1* 102.9*  PLT 266 187 279 168 147*    Basic Metabolic Panel: Recent Labs  Lab 04/03/20 0157 04/03/20 0701 04/04/20 0732 04/05/20 0507 04/06/20 0500 04/07/20 0637  NA 135  --  134* 134* 135 137  K 2.9*   --  3.5 4.0 3.4* 3.7  CL 98  --  100 100 103 104  CO2 25  --  24 25 27 26   GLUCOSE 86  --  69* 106* 87 85  BUN 18  --  14 13 12 10   CREATININE 0.82  --  0.62 0.60 0.60 0.56  CALCIUM 7.7*  --  7.3* 7.7* 7.5* 7.5*  MG  --  2.1 2.0 1.9  --   --   PHOS  --   --  2.8 2.7  --   --      Liver Function Tests: Recent Labs  Lab 04/03/20 0157 04/04/20 0732 04/05/20 0507  AST 25 20 28   ALT 17 14 16   ALKPHOS 121 93 100  BILITOT 0.5 0.7 0.9  PROT 5.8* 4.6* 5.0*  ALBUMIN 1.9* 1.4* 1.7*     Antibiotics: Anti-infectives (From admission, onward)   Start     Dose/Rate Route Frequency Ordered Stop   04/06/20 1800  vancomycin (VANCOREADY) IVPB 1000 mg/200 mL  Status:  Discontinued        1,000 mg 200 mL/hr over 60 Minutes Intravenous Every 24 hours 04/05/20 1639 04/06/20 1047   04/05/20 1800  piperacillin-tazobactam (ZOSYN) IVPB 3.375 g  Status:  Discontinued        3.375 g 12.5 mL/hr over 240 Minutes Intravenous Every 8 hours 04/05/20 1627 04/07/20 0936   04/05/20 1730  vancomycin (VANCOREADY) IVPB 1250 mg/250 mL        1,250 mg 166.7 mL/hr over 90 Minutes Intravenous  Once 04/05/20 1633 04/05/20 1904   04/03/20 0800  cefTRIAXone (ROCEPHIN) 2 g in sodium chloride 0.9 % 100 mL IVPB  Status:  Discontinued        2 g 200 mL/hr over 30 Minutes Intravenous Every 24 hours 04/03/20 0624 04/05/20 1617   04/03/20 0800  azithromycin (ZITHROMAX) 500 mg in sodium chloride 0.9 % 250 mL IVPB  Status:  Discontinued        500 mg 250 mL/hr over 60 Minutes Intravenous Every 24 hours 04/03/20 0624 04/05/20 1617       DVT prophylaxis: Lovenox  Code Status: DNR  Family Communication: Discussed with patient's sister at bedside   Consultants:  Palliative care  Procedures:      Objective   Vitals:   04/07/20 0424 04/07/20 0539 04/07/20 0747 04/07/20 1346  BP: (!) 150/128 92/61  100/62  Pulse: 91 83  92  Resp: 14 14  16   Temp: 97.8 F (36.6 C) (!) 97.4 F (36.3 C)  97.7 F (36.5 C)   TempSrc: Oral Oral  Oral  SpO2: 99% 99% 98% 99%  Weight:  58.5 kg    Height:        Intake/Output Summary (Last 24 hours) at 04/07/2020 1356 Last data filed at 04/07/2020 1300 Gross per 24 hour  Intake 1148 ml  Output 450 ml  Net 698 ml    03/02 1901 - 03/04 0700 In: 1212.9 [P.O.:600; I.V.:110] Out: [place] Weights  04/03/20 0556 04/05/20 0500 04/07/20 0539  Weight: 56.5 kg 55.7 kg 58.5 kg    Physical Examination:   General-appears in no acute distress Heart-S1-S2, regular, no murmur auscultated Lungs-clear to auscultation bilaterally, no wheezing or crackles auscultated Abdomen-soft, nontender, no organomegaly Extremities-no edema in the lower extremities Neuro-alert, oriented x3, no focal deficit   Status is: Inpatient  Dispo: The patient is from: Home              Anticipated d/c is to: Home with hospice versus skilled facility              Anticipated d/c date is-04/08/2020              Patient currently medically stable for discharge  Barrier to discharge-patient to go home with hospice on 04/08/2020       Data Reviewed:   Recent Results (from the past 240 hour(s))  Blood culture (routine single)     Status: None (Preliminary result)   Collection Time: 04/03/20  1:57 AM   Specimen: BLOOD  Result Value Ref Range Status   Specimen Description   Final    BLOOD BLOOD LEFT FOREARM Performed at [facility], [place] [address], [place], [place] [address]    Special Requests   Final    BOTTLES DRAWN AEROBIC AND ANAEROBIC Blood Culture results may not be optimal due to an inadequate volume of blood received in culture bottles Performed at [facility] [address], [place], [place] [address]    Culture   Final    NO GROWTH 4 DAYS Performed at [facility], [place] [address] [place], [place] [address]    Report Status PENDING  Incomplete  Urine culture     Status: None   Collection Time: 04/03/20  1:57  AM   Specimen: In/Out Cath Urine  Result Value Ref Range Status   Specimen Description   Final    IN/OUT CATH URINE Performed at [place] [address] [place], [place] [address]    Special Requests   Final    NONE Performed at [facility], [place] [address], [place], [place] [address]    Culture   Final    NO GROWTH Performed at [facility], [place] [address] [place], [place] [address]    Report Status 04/06/2020 FINAL  Final  MRSA PCR Screening     Status: None   Collection Time: 04/05/20  6:02 PM   Specimen: Nasal Mucosa; Nasopharyngeal  Result Value Ref Range Status   MRSA by PCR NEGATIVE NEGATIVE Final    Comment:        The GeneXpert MRSA Assay (FDA approved for NASAL specimens only), is one component of a comprehensive MRSA colonization surveillance program. It is not intended to diagnose MRSA infection nor to guide or monitor treatment for MRSA infections. Performed at [facility], [place] [address], [place], [place] [address]     No results for input(s): LIPASE, AMYLASE in the last 168 hours. Recent Labs  Lab 04/03/20 0753  AMMONIA 23    BNP (last 3 results) Recent Labs    04/03/20 0224  BNP 80.3      [place]   [facility] If 7PM-7AM, please contact night-coverage at [email or web address], Office  [phone]   04/07/2020, 1:56 PM  LOS: 4 days

## 2020-04-07 NOTE — Progress Notes (Signed)
Physical Therapy Discharge Patient Details Name: April Huff MRN: [id number] DOB: January 08, 1961 Today's Date: 04/07/2020 Time:  -     Patient discharged from PT services secondary to medical decline -plans are being made to Dc home with Hospice.  will need to re-order PT to resume therapy services.  Please see latest therapy progress note for current level of functioning and progress toward goals.    PGP     April Huff 04/07/2020, 2:00 PM [place] Pager [phone] Office [phone]

## 2020-04-07 NOTE — Progress Notes (Signed)
PROGRESS NOTE    April Huff  EXB:[id number] DOB: 08-20-1960 DOA: 04/03/2020 PCP: Mindi Curling, PA-C   Brief Narrative:   Patient is a 60 y.o. female with past medical history significant for COPD, bipolar disorder, hypothyroidism, and right lung adenocarcinoma status post lobectomy, currently on chemotherapy.  Patient lives at home and has developed progressive worsening of generalized weakness, dyspnea with oxygen saturations down to 80 % and associated falls.  As outpatient, patient recently had a urinary tract infection. In ED, pt was afebrile with blood pressure 92/60, 109/60, heart rate 90, respiratory rate 18, temperature 98.1, oxygen saturation 93% on supplemental oxygen, with decreased breath sounds bilaterally, and bilateral pitting edema.  Pertinent labs: Sodium 135, potassium 2.9, chloride 98, bicarb 25, glucose 86, BUN 18, creatinine 0.82, white count 31.1, hemoglobin 8.6, hematocrit 27.8, platelets 266. SARS COVID-19 negative. Urinalysis specific gravity 1.019, negative for nitrates. Head CT negative for acute changes.  Chest radiograph with bilateral pleural effusions more left than right. EKG 90 bpm, normal axis, normal intervals, sinus rhythm, poor R progression, no ST segment or T wave changes.  CT chest showed positive pulmonary embolism,bilateral pleural effusions more left than right. Ultrasound lower extremities negative for deep vein thrombosis.  CT of the abdomen with acute to subacute thrombosis of the portal vein. Multiple liver metastasis.  Palliative care service has been consulted. Patient's prognosis is poor due to being very weak and deconditioned with very low functional capacity and is unlikely a candidate for further chemotherapy.  Her sister is willing to take patient home with her with home hospice when patient is stable.   Assessment & Plan:   Principal Problem:   Acute respiratory failure with hypoxia (HCC) Active Problems:    Adenocarcinoma of right lung (HCC)   Debility   Macrocytic anemia   Hypokalemia   Leg swelling   COPD (chronic obstructive pulmonary disease) with emphysema (HCC)   Bipolar disorder (HCC)   Hypothyroid   Chest pain   Peripheral edema   Malnutrition of moderate degree  Acute hypoxemic respiratory failure with bilateral pleural effusions more Left than Right, and pulmonary embolism in the setting of lung cancer (stage IIB adenocarcinoma) s/p rt upper lobectomy November 2021, on adjuvant chemotherapy with carboplatin/pemetrexed -CXR shows persistent bibasilar atelectasis and moderate left-sided pleural effusion, with possible pneumonia  -CTA shows showed new lingular pulmonary embolus -Originally on enoxaparinas anticoagulation for PE, now discontinue per Palliative MD and  comfort care status -Currently on 4 L/min per [place], SpO2 93-94 % with goal to maintain  oxygen saturation 92% or greater.  -Vancomycin discontinued, later Zosyn also discontinued due to diarrhea. -Continue bronchodilator therapy albuterol and ipratropium.  -WBC's 21.1->17.4->25.7 (04/05/20)->14.5,->12.7 down trending but elevated, afebrile -Continue antibiotic therapy with Zosyn -Continue outpatient oncology f/u with Dr. Georgiann Cocker in [place]  Portal vein thrombosis -CT abdomen shows subacute thrombosis to portal, SMV and splenic vein.   -Discontinue anticoagulation per palliative MD, comfort care status.  Recent falls, debility -Generalized weakness, fatigue, and debility -CT head negative for acute CVA, ammonium, Vit B12, and folate WNL, TSH elevated -Continue PT, supportive care    Hypothyroidism -TSH elevated at 11.124, currently no home medications -Consider adding synthroid  COPD, chronic stable -Continue bronchodilator therapy, supplemental oxygen as needed  Urinary retention. -Foley catheter placed for urinary retention.  Hypokalemia -K 3.5->4.0->3.4->3.7, replete as needed -Renal function  stable with Crt 0.56  Anemia of chronic disease, chronic stable, in setting of malignancy -Hgb at baseline of 7.1-7.9 -Received 1 U  PRBC's on 04/06/20  Calorie malnutrition, moderate.  -Continue with nutritional supplements.   Depression/Bipolar disorder/Dyskinesia Continue home medications of bupropion, divaloprex, olanzapine, sertraline and trazodone, and velbenazine.  Goals of Care -Palliative following -Patient will be discharged to sister's house with home hospice.   DVT prophylaxis: Lovenox Code Status: DNR Family Communication:  Sister at bedside  Status is: Inpatient  Dispo: The patient is from: Home               Anticipated d/c is to: Home with hospice              Patient currently is not medically stable for discharge   Difficult to place patient: No   Body mass index is 23.59 kg/m.   Consultants:   Palliative care  Procedures:   None  Antimicrobials:   Previously on Vanc and Zosyn, d/c due to diarrhea   Subjective:  Patient is very weak and deconditioned, major complaints are dyspnea with minimal exertion  General exam: Chronically ill and deconditioned. HEENT: Pallor, non-icteric, oral mucosa moist Respiratory system: Clear to auscultation. Respiratory effort decreased. Cardiovascular system: S1 & S2 heard, RRR. No JVD, murmurs, rubs, gallops or clicks. Trace pedal edema. Gastrointestinal system: Abdomen is mildly distended, soft and nontender. No organomegaly or masses felt. Normal bowel sounds heard. Patient has diarrhea today. Central nervous system: Somnolent but easy to arouse. No focal neurological deficits. Extremities: Symmetric 5 x 5 power. Skin: No rashes, lesions or ulcers Psychiatry: Judgement and insight appear normal. Mood & affect appropriate.     Objective: Vitals:   04/07/20 0100 04/07/20 0424 04/07/20 0539 04/07/20 0747  BP: (!) 91/52 (!) 150/128 92/61   Pulse: 95 91 83   Resp: 14 14 14    Temp: 98.5 F (36.9 C)  97.8 F (36.6 C) (!) 97.4 F (36.3 C)   TempSrc:  Oral Oral   SpO2:  99% 99% 98%  Weight:   58.5 kg   Height:        Intake/Output Summary (Last 24 hours) at 04/07/2020 1125 Last data filed at 04/07/2020 0900 Gross per 24 hour  Intake 1028 ml  Output 450 ml  Net 578 ml   Filed Weights   04/03/20 0556 04/05/20 0500 04/07/20 0539  Weight: 56.5 kg 55.7 kg 58.5 kg     Data Reviewed:   CBC: Recent Labs  Lab 04/03/20 0157 04/04/20 0732 04/05/20 0507 04/06/20 0500 04/07/20 0637  WBC 21.1* 17.4* 25.7* 14.5* 12.7*  NEUTROABS 15.8* 12.8* 20.8* 10.9*  --   HGB 8.6* 7.1* 7.9* 6.9* 8.4*  HCT 27.8* 23.7* 26.7* 23.2* 28.0*  MCV 108.6* 110.2* 111.3* 112.1* 102.9*  PLT 266 187 279 168 341*   Basic Metabolic Panel: Recent Labs  Lab 04/03/20 0157 04/03/20 0701 04/04/20 0732 04/05/20 0507 04/06/20 0500 04/07/20 0637  NA 135  --  134* 134* 135 137  K 2.9*  --  3.5 4.0 3.4* 3.7  CL 98  --  100 100 103 104  CO2 25  --  24 25 27 26   GLUCOSE 86  --  69* 106* 87 85  BUN 18  --  14 13 12 10   CREATININE 0.82  --  0.62 0.60 0.60 0.56  CALCIUM 7.7*  --  7.3* 7.7* 7.5* 7.5*  MG  --  2.1 2.0 1.9  --   --   PHOS  --   --  2.8 2.7  --   --    GFR: Estimated Creatinine Clearance: 60.6 mL/min (by  C-G formula based on SCr of 0.56 mg/dL). Liver Function Tests: Recent Labs  Lab 04/03/20 0157 04/04/20 0732 04/05/20 0507  AST 25 20 28   ALT 17 14 16   ALKPHOS 121 93 100  BILITOT 0.5 0.7 0.9  PROT 5.8* 4.6* 5.0*  ALBUMIN 1.9* 1.4* 1.7*   No results for input(s): LIPASE, AMYLASE in the last 168 hours. Recent Labs  Lab 04/03/20 0753  AMMONIA 23   Coagulation Profile: Recent Labs  Lab 04/03/20 0157 04/03/20 1503  INR 2.3* 2.0*   Cardiac Enzymes: No results for input(s): CKTOTAL, CKMB, CKMBINDEX, TROPONINI in the last 168 hours. BNP (last 3 results) No results for input(s): PROBNP in the last 8760 hours. HbA1C: No results for input(s): HGBA1C in the last 72  hours. CBG: Recent Labs  Lab 04/03/20 0240  GLUCAP 101*   Lipid Profile: No results for input(s): CHOL, HDL, LDLCALC, TRIG, CHOLHDL, LDLDIRECT in the last 72 hours. Thyroid Function Tests: Recent Labs    04/05/20 0507  FREET4 0.64   Anemia Panel: No results for input(s): VITAMINB12, FOLATE, FERRITIN, TIBC, IRON, RETICCTPCT in the last 72 hours. Sepsis Labs: Recent Labs  Lab 04/03/20 0157 04/03/20 0701 04/04/20 0732 04/05/20 0507  PROCALCITON  --  0.41 0.24 0.47  LATICACIDVEN 1.5  --   --   --     Recent Results (from the past 240 hour(s))  Blood culture (routine single)     Status: None (Preliminary result)   Collection Time: 04/03/20  1:57 AM   Specimen: BLOOD  Result Value Ref Range Status   Specimen Description   Final    BLOOD BLOOD LEFT FOREARM Performed at [facility], [place] [address] [place], [place] [address]    Special Requests   Final    BOTTLES DRAWN AEROBIC AND ANAEROBIC Blood Culture results may not be optimal due to an inadequate volume of blood received in culture bottles Performed at [place] [address], [place], [place] [address]    Culture   Final    NO GROWTH 4 DAYS Performed at [facility], [place] [address], [place], [place] [address]    Report Status PENDING  Incomplete  Urine culture     Status: None   Collection Time: 04/03/20  1:57 AM   Specimen: In/Out Cath Urine  Result Value Ref Range Status   Specimen Description   Final    IN/OUT CATH URINE Performed at [place] [address], [place], [place] [address]    Special Requests   Final    NONE Performed at [facility] [place] [address], [place], [place] [address]    Culture   Final    NO GROWTH Performed at [facility], [place] [address], [place], [place] [address]    Report Status 04/06/2020 FINAL  Final  MRSA PCR Screening     Status: None   Collection Time: 04/05/20  6:02 PM    Specimen: Nasal Mucosa; Nasopharyngeal  Result Value Ref Range Status   MRSA by PCR NEGATIVE NEGATIVE Final    Comment:        The GeneXpert MRSA Assay (FDA approved for NASAL specimens only), is one component of a comprehensive MRSA colonization surveillance program. It is not intended to diagnose MRSA infection nor to guide or monitor treatment for MRSA infections. Performed at [facility], [place] [address] [place], [place] [address]  Radiology Studies: No results found.      Scheduled Meds: . sodium chloride   Intravenous Once  . buPROPion  150 mg Oral q morning  . Chlorhexidine Gluconate Cloth  6 each Topical Daily  . divalproex  1,000 mg Oral QHS  . enoxaparin (LOVENOX) injection  60 mg Subcutaneous Q12H  . feeding supplement  237 mL Oral BID BM  . furosemide  20 mg Oral Daily  . ipratropium-albuterol  3 mL Nebulization BID  . mometasone-formoterol  2 puff Inhalation BID  . multivitamin with minerals  1 tablet Oral Daily  . OLANZapine  20 mg Oral QHS  . sertraline  150 mg Oral Daily  . sodium chloride flush  10-40 mL Intracatheter Q12H  . traZODone  100 mg Oral QHS  . Valbenazine Tosylate  80 mg Oral QHS   Continuous Infusions:   LOS: 4 days   Time spent= 35 mins    Dede Query, RN NP-Student   If 7PM-7AM, please contact night-coverage  04/07/2020, 11:25 AM

## 2020-04-07 NOTE — Progress Notes (Signed)
Manufacturing engineer [facility])  Received request from [facility] for hospice services at home after discharge.  Chart and pt information under review by [facility] physician.  Hospice eligibility pending at this time.  Hospital liaison spoke with pt's sister, Deshannon, to initiate education related to hospice philosophy and services and to answer any questions at this time.  Marilynne verbalized understanding of information given.    [place] send signed and completed DNR home with pt/family.  Please provide prescriptions at discharge as needed to ensure ongoing symptom management until patient can be admitted onto hospice services.    DME needs discussed.  Pt will need hospital bed, OBT, and her O2 concentrator switched out for a new one.  Per Butch Penny, she needs this afternoon to prepare for the DME and can accept delivery tomorrow morning.  Address has been verified and is correct in the chart.  ACC information and contact numbers given to [facility].  Above information shared with Urban Gibson., [facility] Manager.  Please call with any questions or concerns.  Thank you for the opportunity to participate in this pt's care.  Domenic Moras, BSN, RN [employer] [phone] (24h on call)

## 2020-04-07 NOTE — Care Management Important Message (Signed)
Important Message  Patient Details IM Letter given to the Patient. Name: April Huff MRN: [id number] Date of Birth: September 03, 1960   Medicare Important Message Given:  Yes     Kerin Salen 04/07/2020, 10:18 AM

## 2020-04-08 DIAGNOSIS — J9601 Acute respiratory failure with hypoxia: Secondary | ICD-10-CM | POA: Diagnosis not present

## 2020-04-08 DIAGNOSIS — C3491 Malignant neoplasm of unspecified part of right bronchus or lung: Secondary | ICD-10-CM | POA: Diagnosis not present

## 2020-04-08 LAB — TYPE AND SCREEN
ABO/RH(D): O POS
Antibody Screen: NEGATIVE
Unit division: 0

## 2020-04-08 LAB — BPAM RBC
Blood Product Expiration Date: 202204012359
ISSUE DATE / TIME: 202203040035
Unit Type and Rh: 5100

## 2020-04-08 LAB — CULTURE, BLOOD (SINGLE): Culture: NO GROWTH

## 2020-04-08 MED ORDER — HEPARIN SOD (PORK) LOCK FLUSH 100 UNIT/ML IV SOLN
500.0000 [IU] | Freq: Once | INTRAVENOUS | Status: AC
  Administered 2020-04-08: 500 [IU] via INTRAVENOUS

## 2020-04-08 NOTE — Progress Notes (Signed)
PROGRESS NOTE    April Huff  ZCH:[id number] DOB: Jul 20, 1960 DOA: 04/03/2020 PCP: Mindi Curling, PA-C   Brief Narrative:   Patient is a 60 y.o. female with past medical history significant for COPD, bipolar disorder, hypothyroidism, and right lung adenocarcinoma status post lobectomy, currently on chemotherapy. Patient lives at home and has developed progressive worsening of generalized weakness, dyspnea with oxygen saturations down to 80 % and associated falls.  As outpatient, patient recently had aurinary tract infection.In ED, pt was afebrile withblood pressure 92/60, 109/60, heart rate 90, respiratory rate 18, temperature 98.1, oxygen saturation 93% on supplemental oxygen,withdecreased breath sounds bilaterally, and bilateralpittingedema.  Pertinent labs: Sodium 135, potassium 2.9, chloride 98, bicarb 25, glucose 86, BUN 18, creatinine 0.82, white count 31.1, hemoglobin 8.6, hematocrit 27.8, platelets 266. SARS COVID-19 negative. Urinalysis specific gravity 1.019, negative for nitrates. Head CT negative for acute changes.  Chest radiograph with bilateral pleural effusions more left than right. EKG 90 bpm, normal axis, normal intervals, sinus rhythm, poor R progression, no ST segment or T wave changes.  CT chest showed positive pulmonary embolism,bilateral pleural effusions more left than right. Ultrasound lower extremities negative for deep vein thrombosis. CT of the abdomen with acute to subacute thrombosis of the portal vein. Multiple liver metastasis.  Palliative care service has been consulted.Patient's prognosis is poor due to being very weak and deconditioned with very low functional capacity and is unlikelya candidate for further chemotherapy. Her sister is willing to take patient home with her with home hospice when patient is stable.   Assessment & Plan:   Principal Problem:   Acute respiratory failure with hypoxia (HCC) Active Problems:    Adenocarcinoma of right lung (HCC)   Debility   Macrocytic anemia   Hypokalemia   Leg swelling   COPD (chronic obstructive pulmonary disease) with emphysema (HCC)   Bipolar disorder (HCC)   Hypothyroid   Chest pain   Peripheral edema   Malnutrition of moderate degree  Acute hypoxemic respiratory failurewithbilateral pleural effusions more Leftthan Right, and pulmonary embolism in the setting of lung cancer(stage IIB adenocarcinoma) s/p rt upper lobectomy November 2021, on adjuvantchemotherapy with carboplatin/pemetrexed -CXR shows persistent bibasilar atelectasis and moderate left-sided pleural effusion, with possible pneumonia  -CTA shows showed new lingular pulmonary embolus -Originally on enoxaparinas anticoagulation for PE, now discontinue per Palliative MD and  comfort care status -Currently on4L/min per [place], SpO2 93-94 % with goal to maintain oxygen saturation 92% or greater. -Continuebronchodilator therapy albuterol and ipratropium. -WBC's 21.1->17.4->25.7 (04/05/20)->14.5,->12.7 down trending but elevated, afebrile -Vancomycin discontinued, later Zosyn also discontinued due to diarrhea. -Continue outpatient oncology f/u withDr. Georgiann Cocker in [place]  Portal vein thrombosis -CT abdomen shows subacute thrombosis to portal, SMV and splenic vein.  -Discontinue anticoagulation per palliative MD, comfort care status.  Recent falls, debility -Generalized weakness, fatigue, and debility -CT head negative for acute CVA, ammonium, Vit B12, and folate WNL, TSH elevated -Continue PT, supportive care   Hypothyroidism -TSH elevated at 11.124, currently no home medications -Consider adding synthroid  COPD, chronic stable -Continue bronchodilator therapy, supplemental oxygen as needed  Urinary retention. -Foley catheter placed for urinary retention.  Hypokalemia -K 3.5->4.0->3.4->3.7, replete as needed -Renal function stable with Crt 0.56  Anemia of  chronic disease, chronic stable, in setting of malignancy -Hgb at baseline of 7.1-7.9 -Received 1 U PRBC's on 04/06/20  Calorie malnutrition, moderate. -Continue with nutritional supplements.   Depression/Bipolar disorder/Dyskinesia Continue home medications ofbupropion, divaloprex, olanzapine, sertraline and trazodone, andvelbenazine.  Goals of Care -Palliative following -  Patient will be discharged to sister's house with home hospice.   DVT prophylaxis: Lovenox Code Status: DNR Family Communication:  Sister at bedside  Status is: Inpatient  Dispo: The patient is from: Home   Anticipated d/c is to: Home with hospice  Patient currently is not medically stable for discharge              Difficult to place patient: No  Body mass index is 23.55 kg/m.   Consultants:   Palliative care  Procedures:   None  Antimicrobials:   Previously on Vanc and Zosyn, d/c due to diarrhea   Subjective:  Patient is very weak and deconditioned, major complaints are dyspnea with minimal exertion  General exam:Chronically ill and deconditioned. HEENT: Pallor, non-icteric, oral mucosa moist Respiratory system: Clear to auscultation. Respiratory effortdecreased. Cardiovascular system:S1 &S2 heard, RRR. No JVD, murmurs, rubs, gallops or clicks. Tracepedal edema. Gastrointestinal system:Abdomen is mildlydistended, soft and nontender. No organomegaly or masses felt. Normal bowel sounds heard. No reports of diarrhea today. Central nervous system:Somnolent but easy to arouse. No focal neurological deficits. Extremities: Symmetric 5 x 5 power. Skin: No rashes, lesions or ulcers Psychiatry:Judgement and insight appear normal. Mood &affect appropriate.    Objective: Vitals:   04/07/20 1938 04/07/20 2100 04/08/20 0451 04/08/20 0722  BP:  95/62 (!) 104/58   Pulse:  95 97   Resp:  18 20   Temp:  (!) 97.5 F (36.4 C) 98.2 F (36.8 C)   TempSrc:   Oral Oral   SpO2: 98% 93% 92% 99%  Weight:   58.4 kg   Height:        Intake/Output Summary (Last 24 hours) at 04/08/2020 1129 Last data filed at 04/08/2020 3893 Gross per 24 hour  Intake 358 ml  Output 700 ml  Net -342 ml   Filed Weights   04/05/20 0500 04/07/20 0539 04/08/20 0451  Weight: 55.7 kg 58.5 kg 58.4 kg     Data Reviewed:   CBC: Recent Labs  Lab 04/03/20 0157 04/04/20 0732 04/05/20 0507 04/06/20 0500 04/07/20 0637  WBC 21.1* 17.4* 25.7* 14.5* 12.7*  NEUTROABS 15.8* 12.8* 20.8* 10.9*  --   HGB 8.6* 7.1* 7.9* 6.9* 8.4*  HCT 27.8* 23.7* 26.7* 23.2* 28.0*  MCV 108.6* 110.2* 111.3* 112.1* 102.9*  PLT 266 187 279 168 734*   Basic Metabolic Panel: Recent Labs  Lab 04/03/20 0157 04/03/20 0701 04/04/20 0732 04/05/20 0507 04/06/20 0500 04/07/20 0637  NA 135  --  134* 134* 135 137  K 2.9*  --  3.5 4.0 3.4* 3.7  CL 98  --  100 100 103 104  CO2 25  --  24 25 27 26   GLUCOSE 86  --  69* 106* 87 85  BUN 18  --  14 13 12 10   CREATININE 0.82  --  0.62 0.60 0.60 0.56  CALCIUM 7.7*  --  7.3* 7.7* 7.5* 7.5*  MG  --  2.1 2.0 1.9  --   --   PHOS  --   --  2.8 2.7  --   --    GFR: Estimated Creatinine Clearance: 60.6 mL/min (by C-G formula based on SCr of 0.56 mg/dL). Liver Function Tests: Recent Labs  Lab 04/03/20 0157 04/04/20 0732 04/05/20 0507  AST 25 20 28   ALT 17 14 16   ALKPHOS 121 93 100  BILITOT 0.5 0.7 0.9  PROT 5.8* 4.6* 5.0*  ALBUMIN 1.9* 1.4* 1.7*   No results for input(s): LIPASE, AMYLASE in the last  168 hours. Recent Labs  Lab 04/03/20 0753  AMMONIA 23   Coagulation Profile: Recent Labs  Lab 04/03/20 0157 04/03/20 1503  INR 2.3* 2.0*   Cardiac Enzymes: No results for input(s): CKTOTAL, CKMB, CKMBINDEX, TROPONINI in the last 168 hours. BNP (last 3 results) No results for input(s): PROBNP in the last 8760 hours. HbA1C: No results for input(s): HGBA1C in the last 72 hours. CBG: Recent Labs  Lab 04/03/20 0240  GLUCAP 101*    Lipid Profile: No results for input(s): CHOL, HDL, LDLCALC, TRIG, CHOLHDL, LDLDIRECT in the last 72 hours. Thyroid Function Tests: No results for input(s): TSH, T4TOTAL, FREET4, T3FREE, THYROIDAB in the last 72 hours. Anemia Panel: No results for input(s): VITAMINB12, FOLATE, FERRITIN, TIBC, IRON, RETICCTPCT in the last 72 hours. Sepsis Labs: Recent Labs  Lab 04/03/20 0157 04/03/20 0701 04/04/20 0732 04/05/20 0507  PROCALCITON  --  0.41 0.24 0.47  LATICACIDVEN 1.5  --   --   --     Recent Results (from the past 240 hour(s))  Blood culture (routine single)     Status: None   Collection Time: 04/03/20  1:57 AM   Specimen: BLOOD  Result Value Ref Range Status   Specimen Description   Final    BLOOD BLOOD LEFT FOREARM Performed at [facility], [place] [address] [place], [place] [address]    Special Requests   Final    BOTTLES DRAWN AEROBIC AND ANAEROBIC Blood Culture results may not be optimal due to an inadequate volume of blood received in culture bottles Performed at [place] [address] [place], [place] [address]    Culture   Final    NO GROWTH 5 DAYS Performed at [facility], [place] [address], [place], [place] [address]    Report Status 04/08/2020 FINAL  Final  Urine culture     Status: None   Collection Time: 04/03/20  1:57 AM   Specimen: In/Out Cath Urine  Result Value Ref Range Status   Specimen Description   Final    IN/OUT CATH URINE Performed at [place] [address] [place], [place] [address]    Special Requests   Final    NONE Performed at [facility], [place] [address] [place], [place] [address]    Culture   Final    NO GROWTH Performed at [facility], [place] [address] [place] [address], [place], [place] [address]    Report Status 04/06/2020 FINAL  Final  MRSA PCR Screening     Status: None   Collection Time: 04/05/20  6:02 PM   Specimen: Nasal Mucosa; Nasopharyngeal   Result Value Ref Range Status   MRSA by PCR NEGATIVE NEGATIVE Final    Comment:        The GeneXpert MRSA Assay (FDA approved for NASAL specimens only), is one component of a comprehensive MRSA colonization surveillance program. It is not intended to diagnose MRSA infection nor to guide or monitor treatment for MRSA infections. Performed at [facility], [place] [address], [place], [place] [address]          Radiology Studies: No results found.      Scheduled Meds: . sodium chloride   Intravenous Once  . buPROPion  150 mg Oral q morning  . Chlorhexidine Gluconate Cloth  6 each Topical Daily  . divalproex  1,000 mg Oral QHS  . feeding supplement  237 mL Oral BID BM  . furosemide  20  mg Oral Daily  . ipratropium-albuterol  3 mL Nebulization BID  . mometasone-formoterol  2 puff Inhalation BID  . multivitamin with minerals  1 tablet Oral Daily  . OLANZapine  20 mg Oral QHS  . sertraline  150 mg Oral Daily  . sodium chloride flush  10-40 mL Intracatheter Q12H  . traZODone  100 mg Oral QHS  . Valbenazine Tosylate  80 mg Oral QHS   Continuous Infusions:   LOS: 5 days   Time spent= 35 mins   Dede Query, RN, NP-Student   If 7PM-7AM, please contact night-coverage  04/08/2020, 11:29 AM

## 2020-04-08 NOTE — TOC Progression Note (Signed)
Transition of Care [facility]) - Progression Note    Patient Details  Name: April Huff MRN: [id number] Date of Birth: May 15, 1960  Transition of Care [facility]) CM/SW Contact  Joaquin Courts, RN Phone Number: 04/08/2020, 2:12 PM  Clinical Narrative:    CM received confirmation equipment has been delivered to the home.  Family is requesting [facility] transport for patient.  Transport set up.     Expected Discharge Plan: Home w Hospice Care Barriers to Discharge: No Barriers Identified  Expected Discharge Plan and Services Expected Discharge Plan: [place]   Discharge Planning Services: CM Consult Post Acute Care Choice: Hospice Living arrangements for the past 2 months: Single Family Home Expected Discharge Date: 04/08/20                                     Social Determinants of Health (SDOH) Interventions    Readmission Risk Interventions Readmission Risk Prevention Plan 04/04/2020  Transportation Screening Complete  PCP or Specialist Appt within 3-5 Days Complete  HRI or [place] Complete  Social Work Consult for [place] Planning/Counseling Complete  Palliative Care Screening Not Applicable  Medication Review [employer]) Complete

## 2020-04-08 NOTE — Discharge Summary (Signed)
Physician Discharge Summary  Avyonna Wagoner OAC:[id number] DOB: 07/23/1960 DOA: 04/03/2020  PCP: Mindi Curling, PA-C  Admit date: 04/03/2020 Discharge date: 04/08/2020  Time spent: 50* minutes  Recommendations for Outpatient Follow-up:  1. Patient to be discharged home with hospice  Discharge Diagnoses:  Principal Problem:   Acute respiratory failure with hypoxia ([place]) Active Problems:   Adenocarcinoma of right lung (HCC)   Debility   Macrocytic anemia   Hypokalemia   Leg swelling   COPD (chronic obstructive pulmonary disease) with emphysema (HCC)   Bipolar disorder (HCC)   Hypothyroid   Chest pain   Peripheral edema   Malnutrition of moderate degree   Discharge Condition: Stable  Diet recommendation: Comfort diet  Filed Weights   04/05/20 0500 04/07/20 0539 04/08/20 0451  Weight: 55.7 kg 58.5 kg 58.4 kg    History of present illness:  60 year old female, with medical history of COPD, bipolar disorder, hypothyroidism, adenocarcinoma of right lung status post right upper lobectomy now on chemotherapy came to ED with complaints of generalized weakness, shortness of breath, falls.  Patient has been becoming increasingly fatigued resulting in multiple falls.  She was reportedly had O2 sats of 80% at home.  In the ED chest x-ray showed left base atelectasis infiltrate.  Patient was started on antibiotic therapy and further work-up with CT chest showed pulmonary embolism, bilateral pleural effusion.  Venous duplex of lower extremities were negative for DVT.  CT of the abdomen showed acute to subacute thrombosis of the portal vein, multiple liver metastasis. Palliative care was consulted.  Hospital Course:  1. Acute hypoxemic respiratory failure-secondary to pulmonary embolism in setting of lung cancer, possible pneumonia.  Currently requiring 4 L/min of oxygen via nasal cannula.  Patient is currently on vancomycin and Zosyn.  Vancomycin was discontinued.  Zosyn also was  discontinued due to diarrhea. 2. Pulmonary embolism-CTA chest showed small single pulmonary embolus in the lingular pulmonary artery branch.  Patient was started on anticoagulation with Lovenox, however patient is now going home with hospice.  Discussed with palliative care Dr. Rowe Pavy, we both agree that continuing Lovenox at home will not be comfort.  Lovenox was discontinued.   3. COPD-no exacerbation.  Continue as needed bronchodilator therapy. 4. 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  Follows Dr. Georgiann Cocker in [place].  Patient is now comfort measures only. 5. 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  Anticoagulation was stopped after discussion with palliative care 6. Anemia-likely in setting of malignancy.  Hemoglobin was 6.9 yesterday, s/p 1 unit PRBC transfusion.  Last hemoglobin is 8.4  7. Hypokalemia-replete 8. Goals of care-palliative care was consulted, patient and sister both agree to go home with hospice.   Procedures:    Consultations: Palliative care Discharge Exam: Vitals:   04/08/20 0722 04/08/20 1254  BP:  115/90  Pulse:  87  Resp:  15  Temp:  98.3 F (36.8 C)  SpO2: 99% 100%    Discharge Instructions   Discharge Instructions    Diet - low sodium heart healthy   Complete by: As directed    Increase activity slowly   Complete by: As directed      Allergies as of 04/08/2020      Reactions   Nitrofurantoin    Sulfa Antibiotics       Medication List    STOP taking these medications   amoxicillin-clavulanate 875-125 MG tablet Commonly known as:  AUGMENTIN   aspirin 81 MG EC tablet     TAKE these medications   acetaminophen 325 MG tablet Commonly known as: TYLENOL Take 650 mg by mouth every 6 (six) hours as needed for mild pain, fever or headache.   buPROPion 150 MG 24 hr tablet Commonly known as: WELLBUTRIN XL Take 150 mg  by mouth every morning.   chlorproMAZINE 25 MG tablet Commonly known as: THORAZINE Take 25 mg by mouth 3 (three) times daily as needed for hiccoughs.   dexamethasone 4 MG tablet Commonly known as: DECADRON Take 4 mg by mouth as directed. 4mg  twice daily the day before and the day after chemo   divalproex 500 MG 24 hr tablet Commonly known as: DEPAKOTE ER Take 1,000 mg by mouth at bedtime.   Ingrezza 80 MG Caps Generic drug: Valbenazine Tosylate Take 80 mg by mouth at bedtime.   lidocaine-prilocaine cream Commonly known as: EMLA Apply 1 application topically as needed. Port access   OLANZapine 20 MG tablet Commonly known as: ZYPREXA Take 20 mg by mouth at bedtime.   ondansetron 4 MG tablet Commonly known as: ZOFRAN Take 4 mg by mouth every 8 (eight) hours as needed for nausea/vomiting.   sertraline 100 MG tablet Commonly known as: ZOLOFT Take 150 mg by mouth daily.   traZODone 100 MG tablet Commonly known as: DESYREL Take 100 mg by mouth at bedtime.      Allergies  Allergen Reactions  . Nitrofurantoin   . Sulfa Antibiotics       The results of significant diagnostics from this hospitalization (including imaging, microbiology, ancillary and laboratory) are listed below for reference.    Significant Diagnostic Studies: CT HEAD WO CONTRAST  Result Date: 04/03/2020 CLINICAL DATA:  Head trauma. Abnormal mental status. Fell hitting head on cement. EXAM: CT HEAD WITHOUT CONTRAST TECHNIQUE: Contiguous axial images were obtained from the base of the skull through the vertex without intravenous contrast. COMPARISON:  None. FINDINGS: Brain: No evidence of acute infarction, hemorrhage, hydrocephalus, extra-axial collection or mass lesion/mass effect. There is mild diffuse low-attenuation within the subcortical and periventricular white matter compatible with chronic microvascular disease. Prominence of the sulci and ventricles compatible with brain atrophy. Vascular: No  hyperdense vessel or unexpected calcification. Skull: Normal. Negative for fracture or focal lesion. Sinuses/Orbits: No acute finding. Other: None IMPRESSION: 1. No acute intracranial abnormalities. 2. Mild chronic small vessel ischemic change and age advanced brain atrophy. Electronically Signed   By: Kerby Moors M.D.   On: 04/03/2020 10:29   CT ANGIO CHEST PE W OR WO CONTRAST  Result Date: 04/03/2020 CLINICAL DATA:  Chest pain, shortness of breath EXAM: CT ANGIOGRAPHY CHEST WITH CONTRAST TECHNIQUE: Multidetector CT imaging of the chest was performed using the standard protocol during bolus administration of intravenous contrast. Multiplanar CT image reconstructions and MIPs were obtained to evaluate the vascular anatomy. CONTRAST:  61mL OMNIPAQUE IOHEXOL 350 MG/ML SOLN IV COMPARISON:  CT chest 10/26/2019 FINDINGS: Cardiovascular: Atherosclerotic calcifications aorta and proximal great vessels. Aorta normal caliber without aneurysm or dissection. Heart unremarkable. Minimal pericardial effusion. Pulmonary arteries adequately opacified. Pulmonary embolus identified in a lingular pulmonary artery branch. No additional pulmonary emboli identified. RIGHT jugular Port-A-Cath, tip in SVC. Mediastinum/Nodes: Base of cervical region normal appearance. Esophagus unremarkable. No thoracic adenopathy. Lungs/Pleura: BILATERAL pleural effusions LEFT greater than RIGHT. Compressive atelectasis of BILATERAL lower lobes, greater on LEFT. Additional atelectasis in lingula. Interval resection of previously identified RIGHT upper lobe nodule. No new pulmonary mass/nodule identified. Upper Abdomen: Mass  medial RIGHT lobe liver 3.0 x 2.1 cm question metastatic disease. Cannot exclude additional mass at periportal region. Suspected enlarged periportal lymph node 15 mm short axis image 99. Mild perihepatic ascites. Musculoskeletal: No acute osseous lesions. Review of the MIP images confirms the above findings. IMPRESSION: Single  small pulmonary embolus in a lingular pulmonary artery branch. BILATERAL pleural effusions LEFT greater than RIGHT with compressive atelectasis of BILATERAL lower lobes, greater on LEFT. Interval resection of previously identified RIGHT upper lobe nodule. Mass medial RIGHT lobe liver 3.0 x 2.1 cm question metastatic disease. Suspected enlarged periportal lymph node. Aortic Atherosclerosis (ICD10-I70.0). Electronically Signed   By: Lavonia Dana M.D.   On: 04/03/2020 13:47   CT ABDOMEN PELVIS W CONTRAST  Result Date: 04/03/2020 CLINICAL DATA:  History of right upper lobe lung cancer, liver mass on recent chest CT, weakness, fatigue EXAM: CT ABDOMEN AND PELVIS WITH CONTRAST TECHNIQUE: Multidetector CT imaging of the abdomen and pelvis was performed using the standard protocol following bolus administration of intravenous contrast. CONTRAST:  64mL OMNIPAQUE IOHEXOL 300 MG/ML  SOLN COMPARISON:  04/03/2020 FINDINGS: Lower chest: Bilateral pleural effusions and lower lobe atelectasis unchanged since chest CT performed earlier today. The lingular pulmonary embolus seen previously is not as well visualized on this exam. Hepatobiliary: 2.8 x 2.2 cm right lobe liver hypodensity remains indeterminate on this single phase exam. Multiple other subcentimeter hypodensities are seen elsewhere in the right lobe, too small to characterize. No biliary dilation. Gallbladder is unremarkable. Pancreas: There is a cystic mass within the dorsum of the pancreatic body measuring 2.8 x 1.7 cm image 31/2. The remainder of the pancreas appears unremarkable. Spleen: Normal in size without focal abnormality. Adrenals/Urinary Tract: The adrenal glands appear unremarkable. The kidneys enhance normally and symmetrically. Excreted contrast from previously performed CT chest limit the evaluation for nonobstructing renal calculi. The bladder is unremarkable. Stomach/Bowel: There is diffuse colonic wall thickening consistent with inflammatory or  infectious colitis. No bowel obstruction or ileus. Vascular/Lymphatic: There is complete thrombosis of the portal vein, splenic vein, and SMV. Surrounding fat stranding and the lack of significant collaterals suggests acute to subacute thrombosis. There is extensive atherosclerosis throughout the aorta and its branches. Multiple enlarged lymph nodes are seen at the root of the mesentery. Largest lymph node conglomerate interposed between the SMA and SMV on image 37/2 measures 15 mm in short axis. There are numerous other subcentimeter lymph nodes at the celiac axis and at the porta hepatis. Reproductive: Status post hysterectomy. No adnexal masses. Other: There is trace free fluid. No free intraperitoneal gas. No abdominal wall hernia. Musculoskeletal: No acute or destructive bony lesions. Reconstructed images demonstrate no additional findings. IMPRESSION: 1. 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 2. Cystic mass within the pancreatic body, neoplasm cannot be excluded. Dedicated pancreatic MRI may be useful. 3. Multiple hypodense lesions within the right lobe liver, largest measuring 2.8 x 2.2 cm. Metastatic disease cannot be excluded. MRI may be useful for further evaluation. 4. Lymphadenopathy at the root of the mesentery, most pronounced along the SMA/SMV distribution. Metastatic disease is suspected. 5. Diffuse colonic wall thickening compatible with inflammatory or infectious colitis. 6. Trace ascites. 7. Stable bilateral pleural effusions and bibasilar consolidation, left greater than right. 8.  Aortic Atherosclerosis (ICD10-I70.0). These results will be called to the ordering clinician or representative by the Radiologist Assistant, and communication documented in the PACS or [employer]. Electronically Signed   By: Randa Ngo M.D.   On:  04/03/2020 20:27   DG CHEST PORT 1 VIEW  Result Date: 04/05/2020 CLINICAL DATA:  Shortness of breath. EXAM: PORTABLE CHEST 1 VIEW  COMPARISON:  04/04/2020.  CT 04/04/2020. FINDINGS: Port-A-Cath noted with tip over SVC. Heart size stable. Persistent bibasilar atelectasis and moderate left-sided pleural effusion. No pneumothorax. IMPRESSION: Persistent bibasilar atelectasis and moderate left-sided pleural effusion. Chest appears similar to prior exams. Electronically Signed   By: Marcello Moores  Register   On: 04/05/2020 05:41   DG CHEST PORT 1 VIEW  Result Date: 04/04/2020 CLINICAL DATA:  Shortness of breath EXAM: PORTABLE CHEST 1 VIEW COMPARISON:  Portable exam 0527 hours compared to 04/03/2020 FINDINGS: RIGHT jugular Port-A-Cath with tip projecting over SVC. Rotated to the RIGHT. Normal heart size, mediastinal contours, and pulmonary vascularity. Persistent LEFT pleural effusion and basilar atelectasis. Minimal RIGHT basilar atelectasis. No segmental consolidation, pneumothorax, or acute osseous findings. IMPRESSION: Persistent LEFT pleural effusion and mild bibasilar atelectasis LEFT greater than RIGHT. Electronically Signed   By: Lavonia Dana M.D.   On: 04/04/2020 08:23   DG Chest Port 1 View  Result Date: 04/03/2020 CLINICAL DATA:  Sepsis EXAM: PORTABLE CHEST 1 VIEW COMPARISON:  CT 10/26/2019 FINDINGS: Small left pleural effusion has developed with associated left basilar atelectasis or infiltrate. Minimal right basilar atelectasis. No pneumothorax. Cardiac size within normal limits. Right internal jugular chest port is seen with its tip within the superior vena cava. Pulmonary vascularity is normal. No acute bone abnormality. IMPRESSION: Interval development of left basilar atelectasis or infiltrate with associated small left pleural effusion. Electronically Signed   By: Fidela Salisbury MD   On: 04/03/2020 02:15   VAS [place] LOWER EXTREMITY VENOUS (DVT)  Result Date: 04/03/2020  Lower Venous DVT Study Indications: Elevated Ddimer.  Risk Factors: Cancer. Comparison Study: No prior studies. Performing Technologist: Oliver Hum RVT   Examination Guidelines: A complete evaluation includes B-mode imaging, spectral Doppler, color Doppler, and power Doppler as needed of all accessible portions of each vessel. Bilateral testing is considered an integral part of a complete examination. Limited examinations for reoccurring indications may be performed as noted. The reflux portion of the exam is performed with the patient in reverse Trendelenburg.  +---------+---------------+---------+-----------+----------+--------------+ RIGHT    CompressibilityPhasicitySpontaneityPropertiesThrombus Aging +---------+---------------+---------+-----------+----------+--------------+ CFV      Full           Yes      Yes                                 +---------+---------------+---------+-----------+----------+--------------+ SFJ      Full                                                        +---------+---------------+---------+-----------+----------+--------------+ FV Prox  Full                                                        +---------+---------------+---------+-----------+----------+--------------+ FV Mid   Full                                                        +---------+---------------+---------+-----------+----------+--------------+  FV DistalFull                                                        +---------+---------------+---------+-----------+----------+--------------+ PFV      Full                                                        +---------+---------------+---------+-----------+----------+--------------+ POP      Full           Yes      Yes                                 +---------+---------------+---------+-----------+----------+--------------+ PTV      Full                                                        +---------+---------------+---------+-----------+----------+--------------+ PERO     Full                                                         +---------+---------------+---------+-----------+----------+--------------+ SSV      None                                         Acute          +---------+---------------+---------+-----------+----------+--------------+   +---------+---------------+---------+-----------+----------+--------------+ LEFT     CompressibilityPhasicitySpontaneityPropertiesThrombus Aging +---------+---------------+---------+-----------+----------+--------------+ CFV      Full           Yes      Yes                                 +---------+---------------+---------+-----------+----------+--------------+ SFJ      Full                                                        +---------+---------------+---------+-----------+----------+--------------+ FV Prox  Full                                                        +---------+---------------+---------+-----------+----------+--------------+ FV Mid   Full                                                        +---------+---------------+---------+-----------+----------+--------------+  FV DistalFull                                                        +---------+---------------+---------+-----------+----------+--------------+ PFV      Full                                                        +---------+---------------+---------+-----------+----------+--------------+ POP      Full           Yes      Yes                                 +---------+---------------+---------+-----------+----------+--------------+ PTV      Full                                                        +---------+---------------+---------+-----------+----------+--------------+ PERO     Full                                                        +---------+---------------+---------+-----------+----------+--------------+     Summary: RIGHT: - Findings consistent with acute superficial vein thrombosis involving the right small saphenous vein.  - There is no evidence of deep vein thrombosis in the lower extremity.  - No cystic structure found in the popliteal fossa.  LEFT: - There is no evidence of deep vein thrombosis in the lower extremity.  - No cystic structure found in the popliteal fossa.  *See table(s) above for measurements and observations. Electronically signed by Servando Snare MD on 04/03/2020 at 5:55:16 PM.    Final     Microbiology: Recent Results (from the past 240 hour(s))  Blood culture (routine single)     Status: None   Collection Time: 04/03/20  1:57 AM   Specimen: BLOOD  Result Value Ref Range Status   Specimen Description   Final    BLOOD BLOOD LEFT FOREARM Performed at [facility], [place] [address], [place], [place] [address]    Special Requests   Final    BOTTLES DRAWN AEROBIC AND ANAEROBIC Blood Culture results may not be optimal due to an inadequate volume of blood received in culture bottles Performed at [place] [address], [place], [place] [address]    Culture   Final    NO GROWTH 5 DAYS Performed at [facility], [place] [address] [place], [place] [address]    Report Status 04/08/2020 FINAL  Final  Urine culture     Status: None   Collection Time: 04/03/20  1:57 AM   Specimen: In/Out Cath Urine  Result Value Ref Range Status   Specimen Description   Final    IN/OUT CATH URINE Performed at [place] [address] [place], [place]  [address]    Special Requests   Final    NONE Performed at [facility], [place] [address], [place], [place] [address]    Culture   Final    NO GROWTH Performed at [facility], [place] [address] [place], [place] [address]    Report Status 04/06/2020 FINAL  Final  MRSA PCR Screening     Status: None   Collection Time: 04/05/20  6:02 PM   Specimen: Nasal Mucosa; Nasopharyngeal  Result Value Ref Range Status   MRSA by PCR NEGATIVE NEGATIVE Final    Comment:        The  GeneXpert MRSA Assay (FDA approved for NASAL specimens only), is one component of a comprehensive MRSA colonization surveillance program. It is not intended to diagnose MRSA infection nor to guide or monitor treatment for MRSA infections. Performed at [facility], [place] [address] [place], [place] [address]      Labs: Basic Metabolic Panel: Recent Labs  Lab 04/03/20 0157 04/03/20 0701 04/04/20 0732 04/05/20 0507 04/06/20 0500 04/07/20 0637  NA 135  --  134* 134* 135 137  K 2.9*  --  3.5 4.0 3.4* 3.7  CL 98  --  100 100 103 104  CO2 25  --  24 25 27 26   GLUCOSE 86  --  69* 106* 87 85  BUN 18  --  14 13 12 10   CREATININE 0.82  --  0.62 0.60 0.60 0.56  CALCIUM 7.7*  --  7.3* 7.7* 7.5* 7.5*  MG  --  2.1 2.0 1.9  --   --   PHOS  --   --  2.8 2.7  --   --    Liver Function Tests: Recent Labs  Lab 04/03/20 0157 04/04/20 0732 04/05/20 0507  AST 25 20 28   ALT 17 14 16   ALKPHOS 121 93 100  BILITOT 0.5 0.7 0.9  PROT 5.8* 4.6* 5.0*  ALBUMIN 1.9* 1.4* 1.7*   No results for input(s): LIPASE, AMYLASE in the last 168 hours. Recent Labs  Lab 04/03/20 0753  AMMONIA 23   CBC: Recent Labs  Lab 04/03/20 0157 04/04/20 0732 04/05/20 0507 04/06/20 0500 04/07/20 0637  WBC 21.1* 17.4* 25.7* 14.5* 12.7*  NEUTROABS 15.8* 12.8* 20.8* 10.9*  --   HGB 8.6* 7.1* 7.9* 6.9* 8.4*  HCT 27.8* 23.7* 26.7* 23.2* 28.0*  MCV 108.6* 110.2* 111.3* 112.1* 102.9*  PLT 266 187 279 168 147*   Cardiac Enzymes: No results for input(s): CKTOTAL, CKMB, CKMBINDEX, TROPONINI in the last 168 hours. BNP: BNP (last 3 results) Recent Labs    04/03/20 0224  BNP 80.3    ProBNP (last 3 results) No results for input(s): PROBNP in the last 8760 hours.  CBG: Recent Labs  Lab 04/03/20 0240  GLUCAP 101*       Signed:  Oswald Hillock MD.  [facility] 04/08/2020, 2:12 PM

## 2020-04-08 NOTE — Progress Notes (Signed)
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  Patient has been becoming increasingly fatigued resulting in multiple falls.  She was reportedly had O2 sats of 80% at home.  In the ED chest x-ray showed left base atelectasis infiltrate.  Patient was started on antibiotic therapy and further work-up with CT chest showed pulmonary embolism, bilateral pleural effusion.  Venous duplex of lower extremities were negative for DVT.  CT of the abdomen showed acute to subacute thrombosis of the portal vein, multiple liver metastasis. Palliative care was consulted.   Subjective   Patient seen and examined, denies any complaints.   Assessment/Plan:     1. Acute hypoxemic respiratory failure-secondary to pulmonary embolism in setting of lung cancer, possible pneumonia.  Currently requiring 4 L/min of oxygen via nasal cannula.  Patient is currently on vancomycin and Zosyn.  Vancomycin was discontinued.  Zosyn also has been discontinued due to diarrhea. 2. Pulmonary embolism-CTA chest showed small single pulmonary embolus in the lingular pulmonary artery branch.  Patient was started on anticoagulation with Lovenox, however patient is now going home with hospice.  Discussed with palliative care Dr. Rowe Pavy, we both agree that continuing Lovenox at home will not be comfort.  So we will discontinue Lovenox at this time.   3. COPD-no exacerbation.  Continue as needed bronchodilator therapy. 4. 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  Follows Dr. Georgiann Cocker in [place].  Patient is now comfort measures  only. 5. 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  Anticoagulation was stopped after discussion with palliative care 6. Anemia-likely in setting of malignancy.  Hemoglobin was 6.9 yesterday, s/p 1 unit PRBC transfusion.  Today hemoglobin is 8.4. 7. Hypokalemia-replete 8. Goals of care-palliative care was consulted, patient and sister both agree to go home with hospice.     COVID-19 Labs  No results for input(s): DDIMER, FERRITIN, LDH, CRP in the last 72 hours.  No results found for: SARSCOV2NAA   Scheduled medications:   . sodium chloride   Intravenous Once  . buPROPion  150 mg Oral q morning  . Chlorhexidine Gluconate Cloth  6 each Topical Daily  . divalproex  1,000 mg Oral QHS  . feeding supplement  237 mL Oral BID BM  . furosemide  20 mg Oral Daily  . ipratropium-albuterol  3 mL Nebulization BID  . mometasone-formoterol  2 puff Inhalation BID  . multivitamin with minerals  1 tablet Oral Daily  . OLANZapine  20 mg Oral QHS  . sertraline  150 mg Oral Daily  . sodium chloride flush  10-40 mL Intracatheter Q12H  . traZODone  100 mg Oral QHS  . Valbenazine Tosylate  80 mg Oral QHS         CBG: Recent Labs  Lab 04/03/20 0240  GLUCAP 101*    SpO2: 99 % O2 Flow Rate (L/min): 4 L/min    CBC: Recent Labs  Lab 04/03/20 0157 04/04/20 0732 04/05/20 0507 04/06/20 0500 04/07/20 0637  WBC 21.1* 17.4* 25.7* 14.5* 12.7*  NEUTROABS 15.8* 12.8* 20.8* 10.9*  --   HGB 8.6* 7.1* 7.9* 6.9* 8.4*  HCT 27.8* 23.7* 26.7*  23.2* 28.0*  MCV 108.6* 110.2* 111.3* 112.1* 102.9*  PLT 266 187 279 168 147*    Basic Metabolic Panel: Recent Labs  Lab 04/03/20 0157 04/03/20 0701 04/04/20 0732 04/05/20 0507 04/06/20 0500 04/07/20 0637  NA 135  --  134* 134* 135 137  K 2.9*  --  3.5 4.0 3.4* 3.7  CL 98  --  100 100 103 104  CO2 25  --  24 25 27 26   GLUCOSE 86  --  69* 106* 87 85  BUN 18  --  14 13 12 10   CREATININE 0.82  --   0.62 0.60 0.60 0.56  CALCIUM 7.7*  --  7.3* 7.7* 7.5* 7.5*  MG  --  2.1 2.0 1.9  --   --   PHOS  --   --  2.8 2.7  --   --      Liver Function Tests: Recent Labs  Lab 04/03/20 0157 04/04/20 0732 04/05/20 0507  AST 25 20 28   ALT 17 14 16   ALKPHOS 121 93 100  BILITOT 0.5 0.7 0.9  PROT 5.8* 4.6* 5.0*  ALBUMIN 1.9* 1.4* 1.7*     Antibiotics: Anti-infectives (From admission, onward)   Start     Dose/Rate Route Frequency Ordered Stop   04/06/20 1800  vancomycin (VANCOREADY) IVPB 1000 mg/200 mL  Status:  Discontinued        1,000 mg 200 mL/hr over 60 Minutes Intravenous Every 24 hours 04/05/20 1639 04/06/20 1047   04/05/20 1800  piperacillin-tazobactam (ZOSYN) IVPB 3.375 g  Status:  Discontinued        3.375 g 12.5 mL/hr over 240 Minutes Intravenous Every 8 hours 04/05/20 1627 04/07/20 0936   04/05/20 1730  vancomycin (VANCOREADY) IVPB 1250 mg/250 mL        1,250 mg 166.7 mL/hr over 90 Minutes Intravenous  Once 04/05/20 1633 04/05/20 1904   04/03/20 0800  cefTRIAXone (ROCEPHIN) 2 g in sodium chloride 0.9 % 100 mL IVPB  Status:  Discontinued        2 g 200 mL/hr over 30 Minutes Intravenous Every 24 hours 04/03/20 0624 04/05/20 1617   04/03/20 0800  azithromycin (ZITHROMAX) 500 mg in sodium chloride 0.9 % 250 mL IVPB  Status:  Discontinued        500 mg 250 mL/hr over 60 Minutes Intravenous Every 24 hours 04/03/20 0624 04/05/20 1617       DVT prophylaxis: Lovenox  Code Status: DNR  Family Communication: Discussed with patient's sister at bedside   Consultants:  Palliative care  Procedures:      Objective   Vitals:   04/07/20 1938 04/07/20 2100 04/08/20 0451 04/08/20 0722  BP:  95/62 (!) 104/58   Pulse:  95 97   Resp:  18 20   Temp:  (!) 97.5 F (36.4 C) 98.2 F (36.8 C)   TempSrc:  Oral Oral   SpO2: 98% 93% 92% 99%  Weight:   58.4 kg   Height:        Intake/Output Summary (Last 24 hours) at 04/08/2020 1141 Last data filed at 04/08/2020 4163 Gross per  24 hour  Intake 358 ml  Output 700 ml  Net -342 ml    03/03 1901 - 03/05 0700 In: 8453 [P.O.:598; I.V.:110] Out: 1150 [Urine:1150]  Filed Weights   04/05/20 0500 04/07/20 0539 04/08/20 0451  Weight: 55.7 kg 58.5 kg 58.4 kg    Physical Examination:  General-appears in no acute distress Heart-S1-S2, regular, no murmur auscultated Lungs-clear to  auscultation bilaterally, no wheezing or crackles auscultated Abdomen-soft, nontender, no organomegaly Extremities-no edema in the lower extremities Neuro-alert, oriented x3, no focal deficit noted  Status is: Inpatient  Dispo: The patient is from: Home              Anticipated d/c is to: Home with hospice versus skilled facility              Anticipated d/c date is-04/08/2020              Patient currently medically stable for discharge  Barrier to discharge-patient to go home with hospice on 04/08/2020       Data Reviewed:   Recent Results (from the past 240 hour(s))  Blood culture (routine single)     Status: None   Collection Time: 04/03/20  1:57 AM   Specimen: BLOOD  Result Value Ref Range Status   Specimen Description   Final    BLOOD BLOOD LEFT FOREARM Performed at [facility], [place] [address], [place], [place] [address]    Special Requests   Final    BOTTLES DRAWN AEROBIC AND ANAEROBIC Blood Culture results may not be optimal due to an inadequate volume of blood received in culture bottles Performed at [place] [address] [place], [place] [address]    Culture   Final    NO GROWTH 5 DAYS Performed at [facility], [place] [address], [place], [place] [address]    Report Status 04/08/2020 FINAL  Final  Urine culture     Status: None   Collection Time: 04/03/20  1:57 AM   Specimen: In/Out Cath Urine  Result Value Ref Range Status   Specimen Description   Final    IN/OUT CATH URINE Performed at [place] [address], [place], [place]  [address]    Special Requests   Final    NONE Performed at [facility], [place] [address], [place], [place] [address]    Culture   Final    NO GROWTH Performed at [facility], [place] [address], [place], [place] [address]    Report Status 04/06/2020 FINAL  Final  MRSA PCR Screening     Status: None   Collection Time: 04/05/20  6:02 PM   Specimen: Nasal Mucosa; Nasopharyngeal  Result Value Ref Range Status   MRSA by PCR NEGATIVE NEGATIVE Final    Comment:        The GeneXpert MRSA Assay (FDA approved for NASAL specimens only), is one component of a comprehensive MRSA colonization surveillance program. It is not intended to diagnose MRSA infection nor to guide or monitor treatment for MRSA infections. Performed at [facility], [place] [address] [place], [place] [address]     No results for input(s): LIPASE, AMYLASE in the last 168 hours. Recent Labs  Lab 04/03/20 0753  AMMONIA 23    BNP (last 3 results) Recent Labs    04/03/20 0224  BNP 80.3      [place]   [facility] If 7PM-7AM, please contact night-coverage at [email or web address], Office  [phone]   04/08/2020, 11:41 AM  LOS: 5 days

## 2020-04-08 NOTE — Progress Notes (Signed)
[facility])      This patient has been referred for hospice services at home after discharge.  Hospice eligibility has been confirmed by [facility] MD.  DME has been delivered to the home.  [facility] will continue to follow for any discharge planning needs and to coordinate admission onto hospice care.    Thank you for the opportunity to participate in this patient's care.     Domenic Moras, BSN, RN [facility] Liaison [phone]   [phone] (24h on call)

## 2020-04-08 NOTE — Progress Notes (Signed)
Sister [name] called and notified that patient is on her way home via [facility]. Discharge instructions sent with [facility], instructed sister to ask for them.

## 2020-05-05 DEATH — deceased

## 2022-02-17 IMAGING — CT CT HEAD W/O CM
4 series · 16 of 47 positions shown, 18 images · non-contrast
Comparison: None.

CLINICAL DATA: Head trauma. Abnormal mental status. Fell hitting
head on cement.

EXAM:
CT HEAD WITHOUT CONTRAST
TECHNIQUE: Contiguous axial images were obtained from the base of the skull
through the vertex without intravenous contrast.

[Series 2: head wo · axial · 0.47mm/px · z∈[-140,-34]mm · 7 of 29 slices shown, 9 images]
[im 4/29  brain]
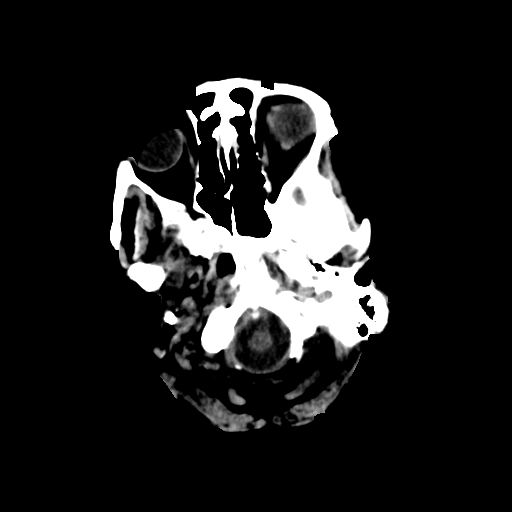
[im 4/29  bone]
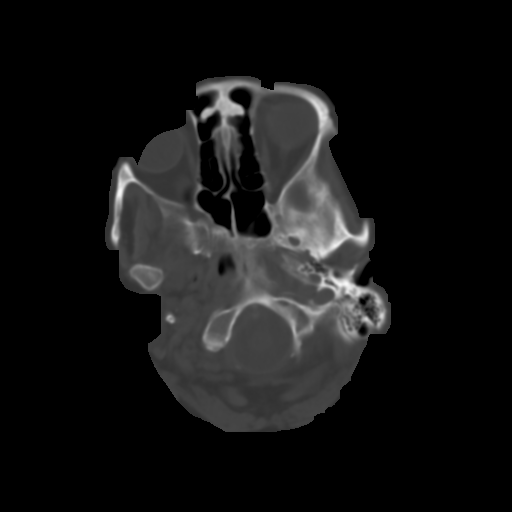
[im 8/29  brain]
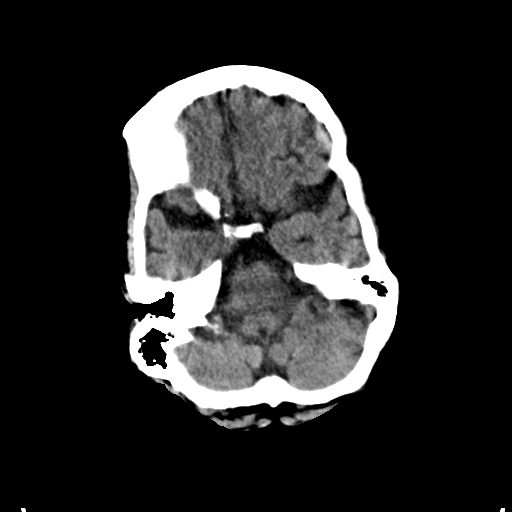
[im 11/29  brain]
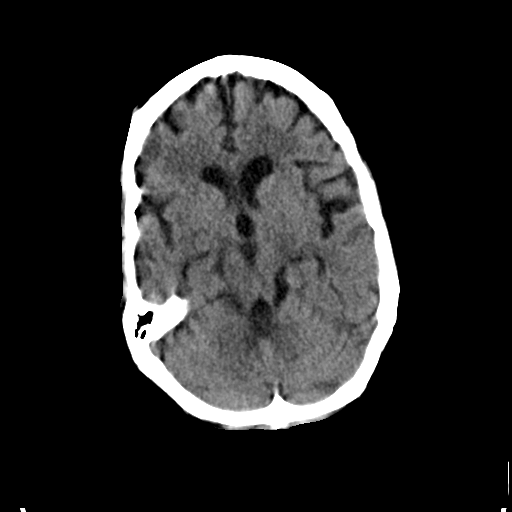
[im 15/29  brain]
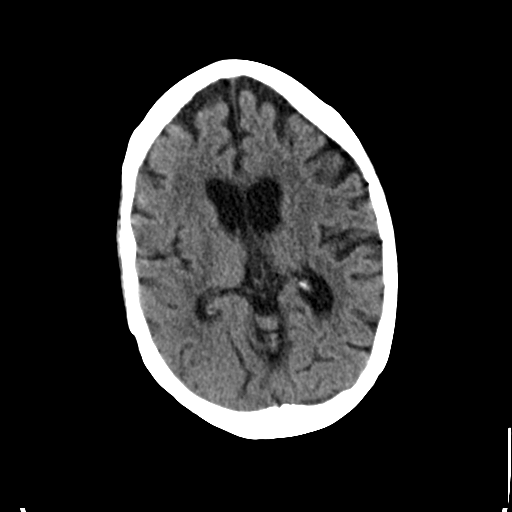
[im 18/29  brain]
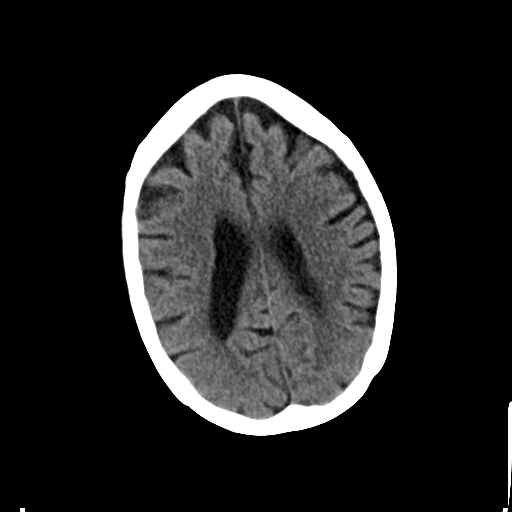
[im 18/29  bone]
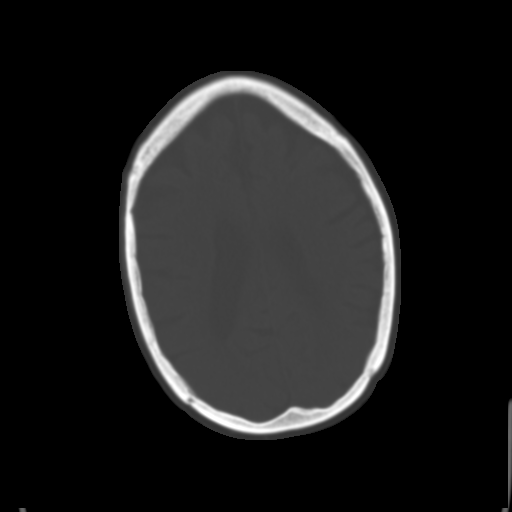
[im 22/29  brain]
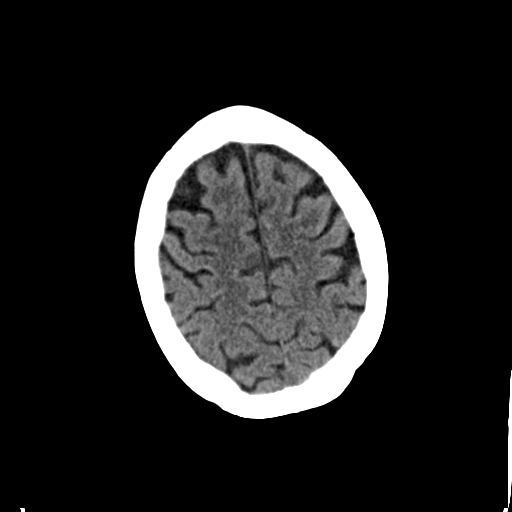
[im 25/29  brain]
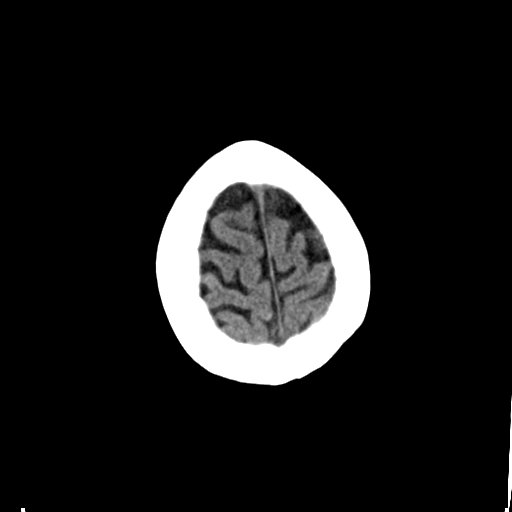

[Series 3: head bone · axial · 0.47mm/px · z∈[-140,-112]mm · 3 of 72 slices shown]
[im 8/72  bone]
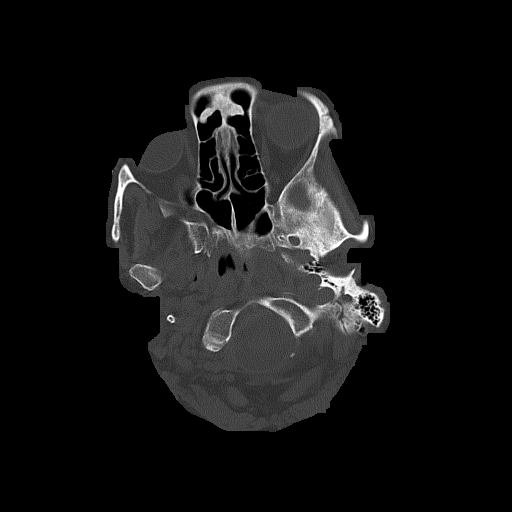
[im 15/72  bone]
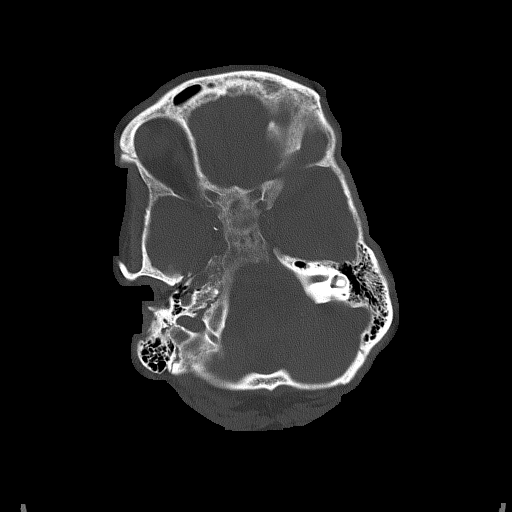
[im 22/72  bone]
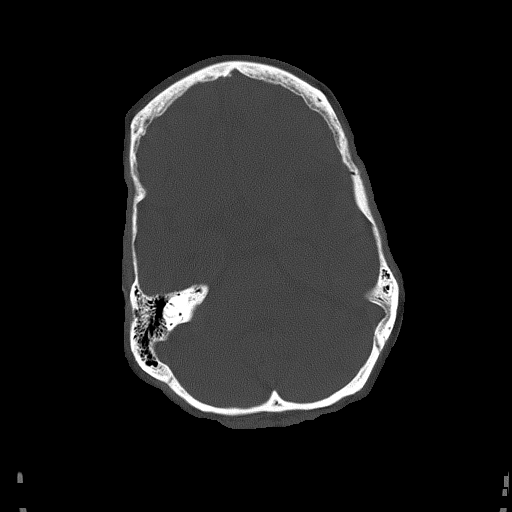

[Series 4: coronal soft tissue · coronal · 0.29mm/px · 3 of 67 slices shown]
[im 23/67  brain]
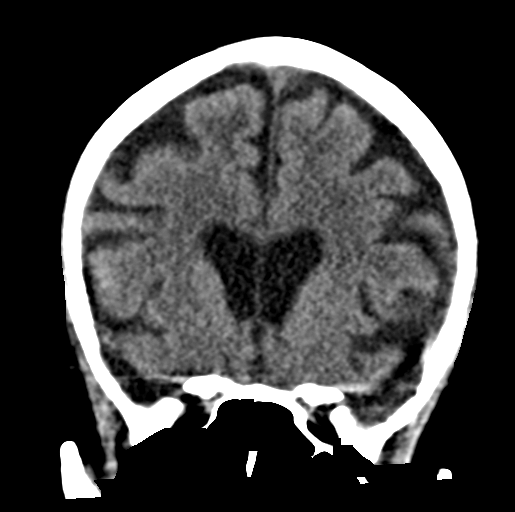
[im 30/67  brain]
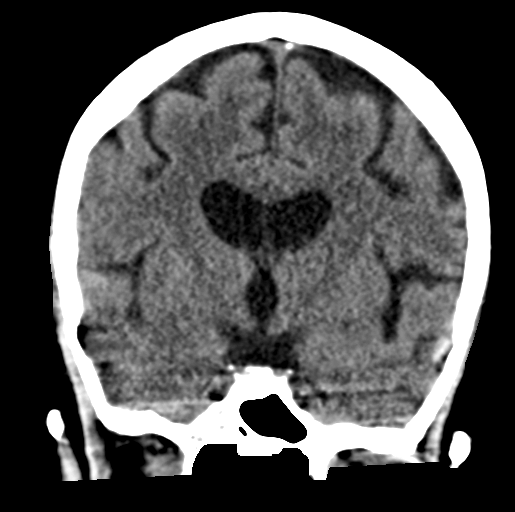
[im 37/67  brain]
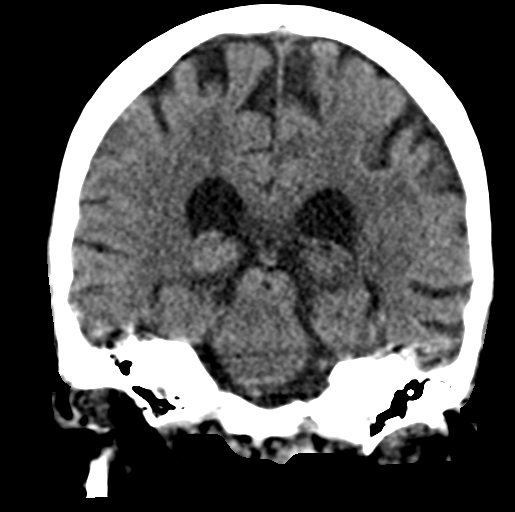

[Series 5: sagittal soft tissue · sagittal · 0.28mm/px · 3 of 51 slices shown]
[im 17/51  brain]
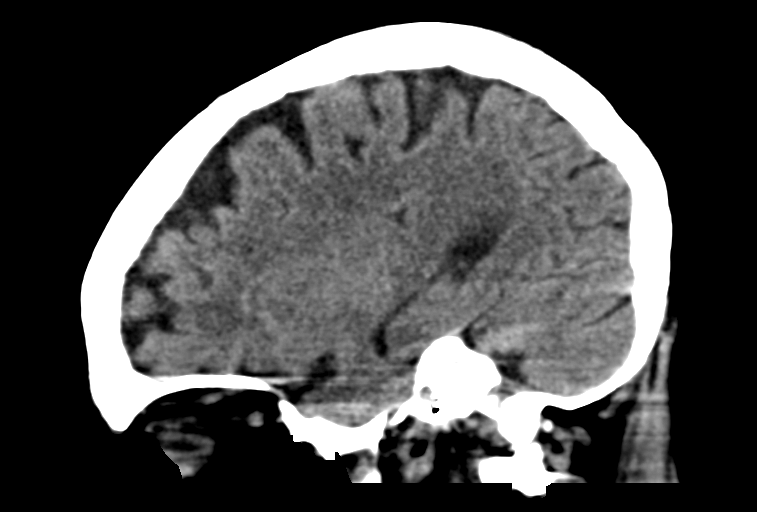
[im 26/51  brain]
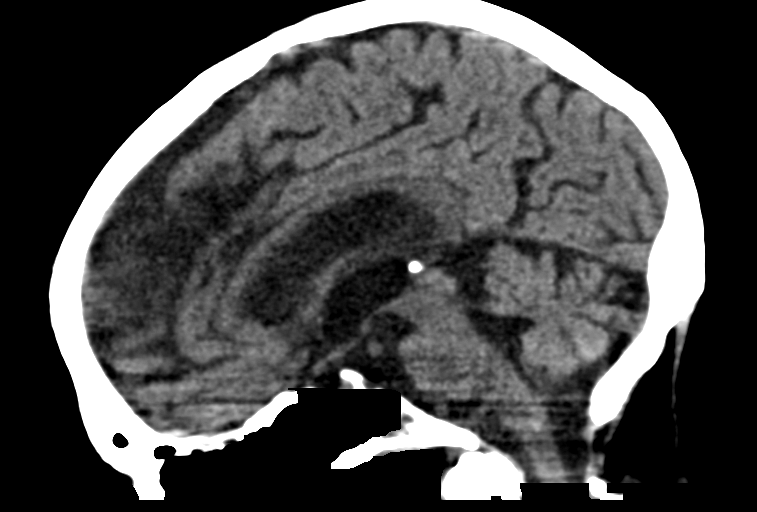
[im 34/51  brain]
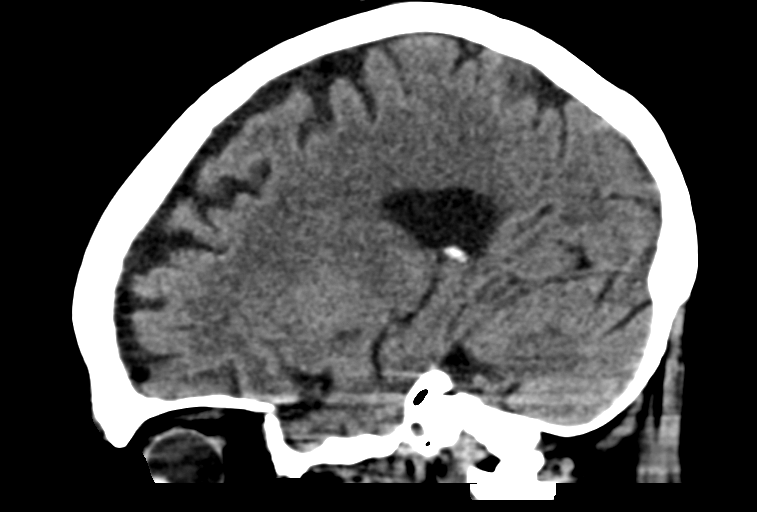

[16 of 47 positions shown; findings below may reference images not displayed]

FINDINGS: Brain: No evidence of acute infarction, hemorrhage, hydrocephalus,
extra-axial collection or mass lesion/mass effect. There is mild
diffuse low-attenuation within the subcortical and periventricular
white matter compatible with chronic microvascular disease.
Prominence of the sulci and ventricles compatible with brain
atrophy.

Vascular: No hyperdense vessel or unexpected calcification.

Skull: Normal. Negative for fracture or focal lesion.

Sinuses/Orbits: No acute finding.

Other: None
IMPRESSION: 1. No acute intracranial abnormalities.
2. Mild chronic small vessel ischemic change and age advanced brain
atrophy.

## 2022-02-17 IMAGING — DX DG CHEST 1V PORT
1 series · 1 of 1 positions shown · non-contrast
Comparison: CT 10/26/2019

CLINICAL DATA: Sepsis

EXAM:
PORTABLE CHEST 1 VIEW

[chest ap]
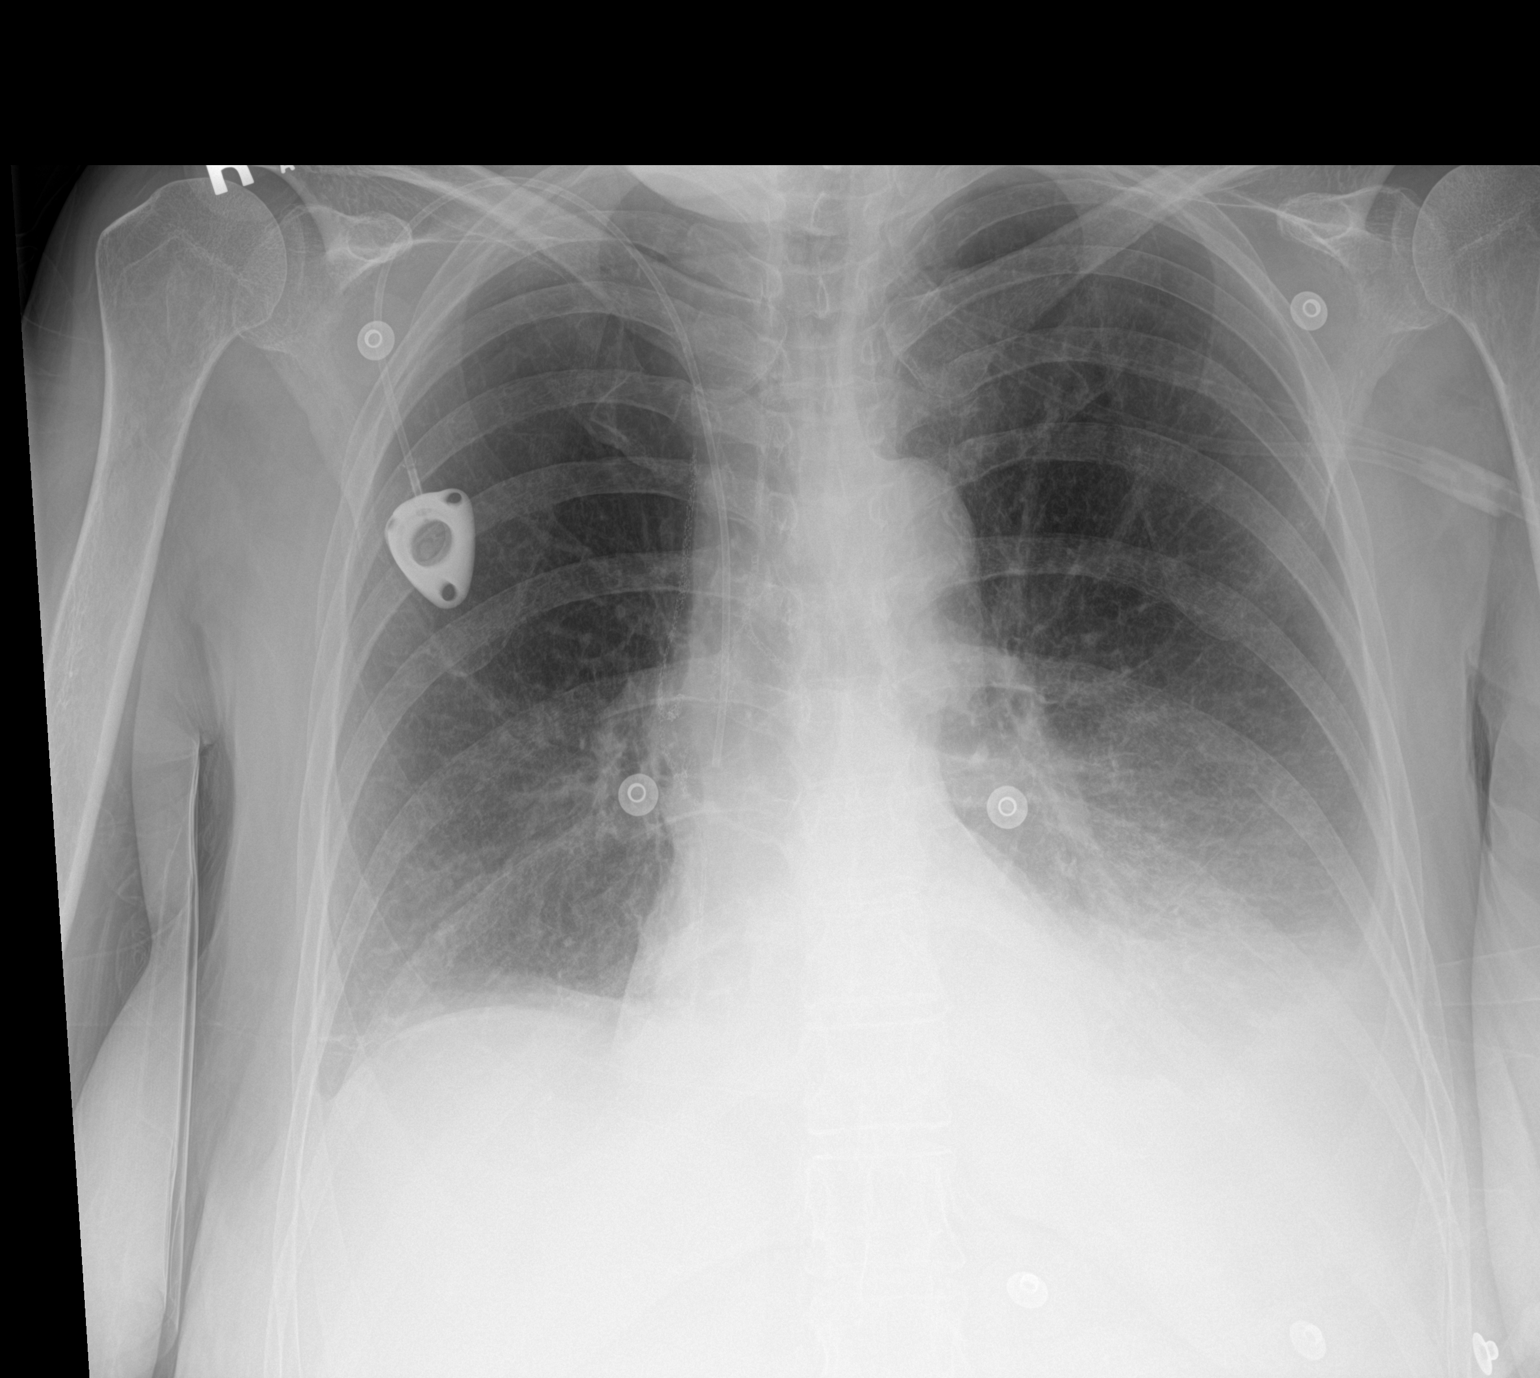

[1 of 1 positions shown; findings below may reference images not displayed]

FINDINGS: Small left pleural effusion has developed with associated left
basilar atelectasis or infiltrate. Minimal right basilar
atelectasis. No pneumothorax. Cardiac size within normal limits.
Right internal jugular chest port is seen with its tip within the
superior vena cava. Pulmonary vascularity is normal. No acute bone
abnormality.
IMPRESSION: Interval development of left basilar atelectasis or infiltrate with
associated small left pleural effusion.
